# Patient Record
Sex: Female | Born: 1975 | Race: White | Hispanic: No
Health system: Southern US, Academic
[De-identification: ages and names within clinical notes are randomized; demographics above are authoritative.]

## PROBLEM LIST (undated history)

## (undated) ENCOUNTER — Ambulatory Visit: Payer: MEDICAID

## (undated) ENCOUNTER — Encounter: Attending: Infectious Disease | Primary: Infectious Disease

## (undated) ENCOUNTER — Encounter

## (undated) ENCOUNTER — Ambulatory Visit: Payer: MEDICAID | Attending: Registered" | Primary: Registered"

## (undated) ENCOUNTER — Telehealth

## (undated) ENCOUNTER — Encounter: Attending: Psychiatric/Mental Health | Primary: Psychiatric/Mental Health

## (undated) ENCOUNTER — Encounter
Attending: Student in an Organized Health Care Education/Training Program | Primary: Student in an Organized Health Care Education/Training Program

## (undated) ENCOUNTER — Encounter: Attending: Marriage & Family Therapist | Primary: Marriage & Family Therapist

## (undated) ENCOUNTER — Ambulatory Visit: Payer: MEDICAID | Attending: Family Medicine | Primary: Family Medicine

## (undated) ENCOUNTER — Ambulatory Visit: Payer: MEDICAID | Attending: Addiction (Substance Use Disorder) | Primary: Addiction (Substance Use Disorder)

## (undated) ENCOUNTER — Ambulatory Visit: Payer: Medicaid (Managed Care) | Attending: Orthopaedic Surgery | Primary: Orthopaedic Surgery

## (undated) ENCOUNTER — Ambulatory Visit: Payer: Medicaid (Managed Care)

## (undated) ENCOUNTER — Ambulatory Visit: Payer: MEDICAID | Attending: Psychiatric/Mental Health | Primary: Psychiatric/Mental Health

## (undated) ENCOUNTER — Ambulatory Visit: Payer: MEDICAID | Attending: Physical Medicine & Rehabilitation | Primary: Physical Medicine & Rehabilitation

## (undated) ENCOUNTER — Ambulatory Visit: Payer: PRIVATE HEALTH INSURANCE

## (undated) ENCOUNTER — Encounter: Attending: Clinical | Primary: Clinical

## (undated) ENCOUNTER — Ambulatory Visit

## (undated) ENCOUNTER — Encounter: Attending: "Endocrinology | Primary: "Endocrinology

## (undated) ENCOUNTER — Ambulatory Visit: Payer: PRIVATE HEALTH INSURANCE | Attending: Psychiatric/Mental Health | Primary: Psychiatric/Mental Health

## (undated) ENCOUNTER — Ambulatory Visit: Payer: MEDICAID | Attending: "Endocrinology | Primary: "Endocrinology

## (undated) ENCOUNTER — Ambulatory Visit
Payer: MEDICAID | Attending: Rehabilitative and Restorative Service Providers" | Primary: Rehabilitative and Restorative Service Providers"

## (undated) ENCOUNTER — Ambulatory Visit: Payer: PRIVATE HEALTH INSURANCE | Attending: Marriage & Family Therapist | Primary: Marriage & Family Therapist

## (undated) ENCOUNTER — Telehealth: Attending: Marriage & Family Therapist | Primary: Marriage & Family Therapist

## (undated) ENCOUNTER — Encounter
Attending: Rehabilitative and Restorative Service Providers" | Primary: Rehabilitative and Restorative Service Providers"

## (undated) ENCOUNTER — Ambulatory Visit: Payer: Medicaid (Managed Care) | Attending: Marriage & Family Therapist | Primary: Marriage & Family Therapist

## (undated) ENCOUNTER — Ambulatory Visit: Attending: Clinical | Primary: Clinical

## (undated) ENCOUNTER — Ambulatory Visit
Payer: Medicaid (Managed Care) | Attending: Physical Medicine & Rehabilitation | Primary: Physical Medicine & Rehabilitation

## (undated) ENCOUNTER — Telehealth: Attending: Psychiatric/Mental Health | Primary: Psychiatric/Mental Health

## (undated) ENCOUNTER — Ambulatory Visit
Payer: MEDICAID | Attending: Student in an Organized Health Care Education/Training Program | Primary: Student in an Organized Health Care Education/Training Program

## (undated) ENCOUNTER — Telehealth
Attending: Student in an Organized Health Care Education/Training Program | Primary: Student in an Organized Health Care Education/Training Program

## (undated) ENCOUNTER — Ambulatory Visit: Payer: MEDICAID | Attending: Community Health | Primary: Community Health

## (undated) ENCOUNTER — Encounter: Attending: Addiction (Substance Use Disorder) | Primary: Addiction (Substance Use Disorder)

## (undated) ENCOUNTER — Encounter: Payer: Medicaid (Managed Care) | Attending: Marriage & Family Therapist | Primary: Marriage & Family Therapist

## (undated) ENCOUNTER — Encounter: Attending: Physical Medicine & Rehabilitation | Primary: Physical Medicine & Rehabilitation

## (undated) ENCOUNTER — Encounter: Attending: Family Medicine | Primary: Family Medicine

## (undated) ENCOUNTER — Encounter: Attending: Orthopaedic Surgery | Primary: Orthopaedic Surgery

## (undated) ENCOUNTER — Ambulatory Visit: Payer: MEDICAID | Attending: Marriage & Family Therapist | Primary: Marriage & Family Therapist

## (undated) ENCOUNTER — Encounter: Attending: Diagnostic Radiology | Primary: Diagnostic Radiology

## (undated) ENCOUNTER — Ambulatory Visit: Payer: MEDICAID | Attending: Orthopaedic Surgery | Primary: Orthopaedic Surgery

## (undated) ENCOUNTER — Encounter: Attending: Community Health | Primary: Community Health

## (undated) ENCOUNTER — Telehealth: Attending: Social Worker | Primary: Social Worker

## (undated) ENCOUNTER — Telehealth: Attending: Orthopaedic Surgery | Primary: Orthopaedic Surgery

## (undated) ENCOUNTER — Encounter: Payer: Medicaid (Managed Care) | Attending: Psychiatric/Mental Health | Primary: Psychiatric/Mental Health

## (undated) ENCOUNTER — Ambulatory Visit: Payer: Worker's Compensation

## (undated) ENCOUNTER — Ambulatory Visit: Payer: Medicaid (Managed Care) | Attending: Psychiatric/Mental Health | Primary: Psychiatric/Mental Health

## (undated) ENCOUNTER — Encounter: Attending: Psychiatry | Primary: Psychiatry

## (undated) ENCOUNTER — Telehealth: Attending: Clinical | Primary: Clinical

## (undated) ENCOUNTER — Ambulatory Visit: Payer: MEDICAID | Attending: Internal Medicine | Primary: Internal Medicine

## (undated) ENCOUNTER — Ambulatory Visit: Attending: Orthopaedic Surgery | Primary: Orthopaedic Surgery

## (undated) ENCOUNTER — Encounter
Payer: Worker's Compensation | Attending: Physical Medicine & Rehabilitation | Primary: Physical Medicine & Rehabilitation

## (undated) ENCOUNTER — Ambulatory Visit: Payer: MEDICAID | Attending: Psychosomatic Medicine | Primary: Psychosomatic Medicine

## (undated) ENCOUNTER — Ambulatory Visit: Payer: MEDICAID | Attending: Physician Assistant | Primary: Physician Assistant

## (undated) ENCOUNTER — Inpatient Hospital Stay

## (undated) ENCOUNTER — Ambulatory Visit
Payer: Medicaid (Managed Care) | Attending: Rehabilitative and Restorative Service Providers" | Primary: Rehabilitative and Restorative Service Providers"

## (undated) ENCOUNTER — Telehealth: Attending: Addiction (Substance Use Disorder) | Primary: Addiction (Substance Use Disorder)

## (undated) ENCOUNTER — Encounter: Attending: Psychosomatic Medicine | Primary: Psychosomatic Medicine

## (undated) ENCOUNTER — Telehealth: Attending: Community Health | Primary: Community Health

## (undated) ENCOUNTER — Telehealth: Attending: Nephrology | Primary: Nephrology

## (undated) ENCOUNTER — Telehealth: Attending: "Endocrinology | Primary: "Endocrinology

## (undated) ENCOUNTER — Non-Acute Institutional Stay: Payer: Worker's Compensation

## (undated) ENCOUNTER — Telehealth: Attending: Internal Medicine | Primary: Internal Medicine

## (undated) MED ORDER — BUPROPION HCL SR 150 MG TABLET,12 HR SUSTAINED-RELEASE: 150 mg | tablet | Freq: Two times a day (BID) | 3 refills | 90 days | Status: CN

## (undated) MED ORDER — QUETIAPINE 50 MG TABLET: tablet | 0 refills | 0 days | Status: CN

## (undated) MED ORDER — CHOLECALCIFEROL (VITAMIN D3) 10 MCG (400 UNIT) CAPSULE: ORAL | 0 days

---
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
## 2011-05-01 ENCOUNTER — Emergency Department: Payer: Self-pay | Admitting: Emergency Medicine

## 2013-09-07 ENCOUNTER — Emergency Department: Payer: Self-pay | Admitting: Emergency Medicine

## 2013-09-07 LAB — COMPREHENSIVE METABOLIC PANEL
Albumin: 3.7 g/dL (ref 3.4–5.0)
Alkaline Phosphatase: 71 U/L
Anion Gap: 5 — ABNORMAL LOW (ref 7–16)
Bilirubin,Total: 0.2 mg/dL (ref 0.2–1.0)
Calcium, Total: 8.9 mg/dL (ref 8.5–10.1)
Chloride: 109 mmol/L — ABNORMAL HIGH (ref 98–107)
EGFR (Non-African Amer.): >60
Glucose: 100 mg/dL — ABNORMAL HIGH (ref 65–99)
Osmolality: 273 (ref 275–301)
Potassium: 3.9 mmol/L (ref 3.5–5.1)
SGPT (ALT): 22 U/L (ref 12–78)
Total Protein: 7.3 g/dL (ref 6.4–8.2)

## 2013-09-07 LAB — CBC
MCV: 96 fL (ref 80–100)
Platelet: 338 10*3/uL (ref 150–440)
RBC: 4.6 10*6/uL (ref 3.80–5.20)
RDW: 13.9 % (ref 11.5–14.5)
WBC: 13.1 10*3/uL — ABNORMAL HIGH (ref 3.6–11.0)

## 2013-09-08 LAB — URINALYSIS, COMPLETE
Glucose,UR: NEGATIVE mg/dL (ref 0–75)
Leukocyte Esterase: NEGATIVE
Nitrite: NEGATIVE
RBC,UR: 7 /HPF (ref 0–5)
Specific Gravity: 1.028 (ref 1.003–1.030)
Squamous Epithelial: 3
WBC UR: 5 /HPF (ref 0–5)

## 2013-09-12 ENCOUNTER — Emergency Department: Payer: Self-pay | Admitting: Emergency Medicine

## 2013-09-12 LAB — CBC
HGB: 16.6 g/dL — ABNORMAL HIGH (ref 12.0–16.0)
MCH: 32.2 pg (ref 26.0–34.0)
MCV: 96 fL (ref 80–100)
RBC: 5.14 10*6/uL (ref 3.80–5.20)

## 2013-09-12 LAB — URINALYSIS, COMPLETE
Glucose,UR: NEGATIVE mg/dL (ref 0–75)
Leukocyte Esterase: NEGATIVE
Nitrite: NEGATIVE
Ph: 6 (ref 4.5–8.0)
Protein: NEGATIVE
RBC,UR: 6 /HPF (ref 0–5)
Squamous Epithelial: 16

## 2013-09-12 LAB — COMPREHENSIVE METABOLIC PANEL
Albumin: 4.1 g/dL (ref 3.4–5.0)
Alkaline Phosphatase: 82 U/L
Anion Gap: 4 — ABNORMAL LOW (ref 7–16)
BUN: 8 mg/dL (ref 7–18)
Bilirubin,Total: 0.3 mg/dL (ref 0.2–1.0)
Calcium, Total: 9.2 mg/dL (ref 8.5–10.1)
EGFR (African American): >60
Potassium: 4 mmol/L (ref 3.5–5.1)
SGPT (ALT): 33 U/L (ref 12–78)
Sodium: 137 mmol/L (ref 136–145)
Total Protein: 8.4 g/dL — ABNORMAL HIGH (ref 6.4–8.2)

## 2013-09-12 LAB — LIPASE, BLOOD: Lipase: 78 U/L (ref 73–393)

## 2013-09-18 ENCOUNTER — Ambulatory Visit: Payer: Self-pay | Admitting: Obstetrics and Gynecology

## 2013-09-21 LAB — PATHOLOGY REPORT

## 2013-09-22 ENCOUNTER — Ambulatory Visit: Payer: Self-pay | Admitting: Obstetrics and Gynecology

## 2013-09-22 LAB — COMPREHENSIVE METABOLIC PANEL
Anion Gap: 7 (ref 7–16)
BUN: 6 mg/dL — ABNORMAL LOW (ref 7–18)
Bilirubin,Total: 0.1 mg/dL — ABNORMAL LOW (ref 0.2–1.0)
Chloride: 103 mmol/L (ref 98–107)
Co2: 29 mmol/L (ref 21–32)
Creatinine: 0.85 mg/dL (ref 0.60–1.30)
EGFR (African American): >60
EGFR (Non-African Amer.): >60
Osmolality: 275 (ref 275–301)
Potassium: 3.9 mmol/L (ref 3.5–5.1)
SGPT (ALT): 24 U/L (ref 12–78)
Sodium: 139 mmol/L (ref 136–145)
Total Protein: 6.9 g/dL (ref 6.4–8.2)

## 2013-09-22 LAB — CBC WITH DIFFERENTIAL/PLATELET
Eosinophil #: 0.4 10*3/uL (ref 0.0–0.7)
Eosinophil %: 4.2 %
HGB: 14.3 g/dL (ref 12.0–16.0)
Lymphocyte #: 4.1 10*3/uL — ABNORMAL HIGH (ref 1.0–3.6)
MCHC: 33.2 g/dL (ref 32.0–36.0)
MCV: 97 fL (ref 80–100)
Monocyte #: 0.7 x10 3/mm (ref 0.2–0.9)
Monocyte %: 7 %
Neutrophil #: 4.7 10*3/uL (ref 1.4–6.5)
RBC: 4.43 10*6/uL (ref 3.80–5.20)
RDW: 13 % (ref 11.5–14.5)

## 2013-09-28 ENCOUNTER — Emergency Department: Payer: Self-pay | Admitting: Emergency Medicine

## 2013-09-28 LAB — COMPREHENSIVE METABOLIC PANEL
ALBUMIN: 4 g/dL (ref 3.4–5.0)
ALK PHOS: 84 U/L
Anion Gap: 6 — ABNORMAL LOW (ref 7–16)
BILIRUBIN TOTAL: 0.3 mg/dL (ref 0.2–1.0)
BUN: 6 mg/dL — ABNORMAL LOW (ref 7–18)
CALCIUM: 9.3 mg/dL (ref 8.5–10.1)
CO2: 25 mmol/L (ref 21–32)
CREATININE: 0.79 mg/dL (ref 0.60–1.30)
Chloride: 104 mmol/L (ref 98–107)
EGFR (African American): >60
EGFR (Non-African Amer.): >60
GLUCOSE: 94 mg/dL (ref 65–99)
OSMOLALITY: 267 (ref 275–301)
POTASSIUM: 3.7 mmol/L (ref 3.5–5.1)
SGOT(AST): 39 U/L — ABNORMAL HIGH (ref 15–37)
SGPT (ALT): 56 U/L (ref 12–78)
SODIUM: 135 mmol/L — AB (ref 136–145)
Total Protein: 8.3 g/dL — ABNORMAL HIGH (ref 6.4–8.2)

## 2013-09-28 LAB — URINALYSIS, COMPLETE
BILIRUBIN, UR: NEGATIVE
Bacteria: NONE SEEN
Glucose,UR: NEGATIVE mg/dL (ref 0–75)
KETONE: NEGATIVE
Leukocyte Esterase: NEGATIVE
Nitrite: NEGATIVE
PROTEIN: NEGATIVE
Ph: 6 (ref 4.5–8.0)
RBC,UR: 3 /HPF (ref 0–5)
Specific Gravity: 1.015 (ref 1.003–1.030)
Squamous Epithelial: 13

## 2013-09-28 LAB — CBC WITH DIFFERENTIAL/PLATELET
Basophil #: 0.2 10*3/uL — ABNORMAL HIGH (ref 0.0–0.1)
Basophil %: 1.2 %
Eosinophil #: 0.1 10*3/uL (ref 0.0–0.7)
Eosinophil %: 0.8 %
HCT: 50.7 % — ABNORMAL HIGH (ref 35.0–47.0)
HGB: 17.3 g/dL — ABNORMAL HIGH (ref 12.0–16.0)
Lymphocyte #: 3.3 10*3/uL (ref 1.0–3.6)
Lymphocyte %: 24.6 %
MCH: 32.4 pg (ref 26.0–34.0)
MCHC: 34.1 g/dL (ref 32.0–36.0)
MCV: 95 fL (ref 80–100)
Monocyte #: 1.2 x10 3/mm — ABNORMAL HIGH (ref 0.2–0.9)
Monocyte %: 8.7 %
Neutrophil #: 8.8 10*3/uL — ABNORMAL HIGH (ref 1.4–6.5)
Neutrophil %: 64.7 %
Platelet: 367 10*3/uL (ref 150–440)
RBC: 5.33 10*6/uL — ABNORMAL HIGH (ref 3.80–5.20)
RDW: 13.7 % (ref 11.5–14.5)
WBC: 13.6 10*3/uL — ABNORMAL HIGH (ref 3.6–11.0)

## 2013-09-28 LAB — LIPASE, BLOOD: Lipase: 122 U/L (ref 73–393)

## 2014-11-22 ENCOUNTER — Emergency Department: Payer: Self-pay | Admitting: Emergency Medicine

## 2015-01-14 NOTE — Consult Note (Signed)
Consulting Service: Emergency Department Consulting Physician:  Bayard MalesBrown, Forrest MD  Consulting Question: "Worsening abdominal pain"  History of present Illness: Patient is a 39 year old G3P1021 with ER presentation on 09/12/2013 for evaluation of abdominal pain showing a 7cm right ovarian dermoid cyst.  At that time there was no evidence of torsion although no normal right ovarian tissue could be identified.  The patient has not personal or family history of ovarian cancer or breast cancer.  Pain started about 1 week ago.  Some associated nausea.  She was original scheduled for laproscopic right salpingo-oophorectomy.  Patient was provided a prescription for Percocet and ibuprofen at the time of her clinic evaluation on 09/16/2013.  This evening had sudden exacerbation of pain unrelieved by percocet prompting her presentation to OR.  Review of Systems: 10 point review of systems negative unless otherwise  noted in HPI Past Medical History: none Past Surgical History: Tonsillectomy Family History: Colon cancer, HTN, leukemia Social History: Occasional EtOH, smoker, no illicit substance use Allergies: NKDA Medications: 1) Ibuprofen 600mg  po 1 tab po every 6hrs prn pain 2) Percocet 5/325 1-2 tab po every 4hrs prn pain Physical Exam  Vital Signs: T 95.3, BP 132/79, HR 86, RR 20, O2sat 99% RAAppears uncomfortablenormocephalic, anictericCTABRRRNABS, soft, RLQ pain and tenderness on medium depth palpationdeferred see TVUS reportno edema Laboratory Lactic acid 0.9 : 39 yo with 7cm dermoid cyst, abdominal pain, concern for torsion Plan: 1) Abdominal pain ? in setting of 7cm dermoid cyst concern if for ovarian torsion.  The patient is scheduled for surgery later today.  Will post for emergent right salpingo-oophorectomy. a) No preoperative antibiotics,surgical prep chlorhexaind abdomen, betadine vaginal. dorsal lithotomy position using Allen stirups b) SCD?s for DVT ppx c) Specimen for permanent  section d) Admit to OB/GYN postoperative with anticipated discharge later this morning if doing well    Electronic Signatures: Lorrene ReidStaebler, Jaquon Gingerich M (MD) (Signed on 26-Dec-14 01:49)  Authored   Last Updated: 26-Dec-14 01:52 by Lorrene ReidStaebler, Eswin Worrell M (MD)

## 2015-01-15 NOTE — Op Note (Signed)
PATIENT NAME:  Victoria Ford, Victoria Ford MR#:  [id number] DATE OF BIRTH:  1976/07/12  DATE OF PROCEDURE:  09/18/2013  PREOPERATIVE DIAGNOSES: Abdominal pain and right ovarian cyst.  POSTOPERATIVE DIAGNOSES: Abdominal pain and right ovarian cyst, appearance consistent with a right dermoid.   OPERATION PERFORMED: Laparoscopic right salpingo-oophorectomy.  ANESTHESIA USED: General.   PRIMARY SURGEON: Lorrene ReidAndreas Ford Osha Rane, MD.  ESTIMATED BLOOD LOSS: Minimal.  OPERATIVE FLUIDS: 1 L.  COMPLICATIONS: None.  PREOPERATIVE ANTIBIOTICS: None.   FINDINGS: Large 8 cm right ovarian dermoid cyst with no evidence of torsion, cyst is ruptured in the bag facilitating removal. The right ureter was visualized. Ureter seen coursing away from fiel of dissection prior to and post resection of the ovary. The remainder of the pelvic anatomy is normal.   SPECIMENS REMOVED: Right ovary and tube.   CONDITION FOLLOWING PROCEDURE: Stable.   PROCEDURE IN DETAIL: Risks, benefits and alternatives of the procedure were discussed with the patient prior to proceeding to operating room. The patient was taken to the operating room where she was placed under general endotracheal anesthesia. The patient was prepped and draped in the usual sterile fashion after being positioned in the dorsal lithotomy position using Allen stirrups. Timeout procedure was performed. Attention was turned to the patient's pelvis. An operative speculum was placed, the cervix was visualized. The cervix was grasped with a single-tooth tenaculum and a Hulka tenaculum was then inserted through the cervix to allow manipulation of the uterus. The single-tooth tenaculum and operative  speculum were removed. Attention was turned to the patient's abdomen. A left upper quadrant entry at Palmer's point was undertaken using direct visualization and an Xcel trocar. After gaining entry into the perineum, pneumoperitoneum was established. Two assistant ports, one a 5 mm  umbilical as well as one 11 mm lateral port were placed under visualization. Inspection of the pelvis noted the above findings. The right infundibulopelvic ligament was identified and ligated and transected using the LigaSure device. The tube and ovary were then freed from their attachments from the mesosalpinx using the LigaSure and then transected off the corneal portion of the uterus using the LigaSure device as well.   Following resection of the ovary and dermoid,  the specimen was placed in an Endo Catch bag where it was ruptured. The specimen was drained and then removed through the 10 mm port site. The pelvis was copiously irrigated. The pedicles were inspected and noted to be hemostatic. The ureters were noted to be well away from the area of dissection. The 11 mm port site was closed using an Endo-stitch device. Following closure of the port site, the skin was closed using 4-0 Monocryl in subcuticular fashion. Each port site was then dressed with Dermabond. Sponge, needle, and instrument counts were correct x 2. The patient tolerated the procedure well and was taken to the recovery room in stable condition.    ____________________________ Florina OuAndreas Ford. Bonney AidStaebler, MD ams:aw D: 09/20/2013 22:32:28 ET T: 09/21/2013 04:[id number]:28:24 ET JOB#: [id number]  cc: Florina OuAndreas Ford. Bonney AidStaebler, MD, <Dictator> Carmel SacramentoANDREAS Cathrine MusterM Haward Pope MD ELECTRONICALLY SIGNED 10/15/2013 8:52
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
## 2017-05-22 ENCOUNTER — Ambulatory Visit: Admission: RE | Admit: 2017-05-22 | Discharge: 2017-05-22 | Payer: MEDICAID | Admitting: Family Medicine

## 2017-05-22 DIAGNOSIS — F1910 Other psychoactive substance abuse, uncomplicated: Secondary | ICD-10-CM

## 2017-05-22 DIAGNOSIS — M545 Low back pain: Secondary | ICD-10-CM

## 2017-05-22 DIAGNOSIS — R768 Other specified abnormal immunological findings in serum: Secondary | ICD-10-CM

## 2017-05-22 DIAGNOSIS — G8929 Other chronic pain: Secondary | ICD-10-CM

## 2017-05-22 DIAGNOSIS — F331 Major depressive disorder, recurrent, moderate: Secondary | ICD-10-CM

## 2017-05-22 DIAGNOSIS — F411 Generalized anxiety disorder: Principal | ICD-10-CM

## 2017-05-22 DIAGNOSIS — Z23 Encounter for immunization: Secondary | ICD-10-CM

## 2017-05-22 DIAGNOSIS — F17200 Nicotine dependence, unspecified, uncomplicated: Secondary | ICD-10-CM

## 2017-05-23 ENCOUNTER — Ambulatory Visit
Admission: RE | Admit: 2017-05-23 | Discharge: 2017-05-23 | Disposition: A | Discharge status: Home / Self Care | Payer: MEDICAID

## 2017-05-23 DIAGNOSIS — M545 Low back pain: Principal | ICD-10-CM

## 2017-05-23 DIAGNOSIS — G8929 Other chronic pain: Secondary | ICD-10-CM

## 2017-05-24 MED ORDER — VENLAFAXINE ER 75 MG CAPSULE,EXTENDED RELEASE 24 HR
ORAL_CAPSULE | Freq: Every day | ORAL | 0 refills | 0 days | Status: CP
Start: 2017-05-24 — End: 2017-06-19

## 2017-06-19 ENCOUNTER — Ambulatory Visit: Admission: RE | Admit: 2017-06-19 | Discharge: 2017-06-19 | Payer: MEDICAID

## 2017-06-19 DIAGNOSIS — F411 Generalized anxiety disorder: Secondary | ICD-10-CM

## 2017-06-19 DIAGNOSIS — F331 Major depressive disorder, recurrent, moderate: Principal | ICD-10-CM

## 2017-06-19 MED ORDER — VENLAFAXINE ER 75 MG CAPSULE,EXTENDED RELEASE 24 HR
ORAL_CAPSULE | 0 refills | 0 days | Status: CP
Start: 2017-06-19 — End: 2017-07-17

## 2017-07-17 ENCOUNTER — Ambulatory Visit: Admission: RE | Admit: 2017-07-17 | Discharge: 2017-07-17 | Payer: MEDICAID

## 2017-07-17 DIAGNOSIS — F331 Major depressive disorder, recurrent, moderate: Principal | ICD-10-CM

## 2017-07-17 DIAGNOSIS — J0100 Acute maxillary sinusitis, unspecified: Secondary | ICD-10-CM

## 2017-07-17 DIAGNOSIS — F411 Generalized anxiety disorder: Secondary | ICD-10-CM

## 2017-07-17 DIAGNOSIS — Z124 Encounter for screening for malignant neoplasm of cervix: Secondary | ICD-10-CM

## 2017-07-17 MED ORDER — DULOXETINE 30 MG CAPSULE,DELAYED RELEASE
ORAL_CAPSULE | 0 refills | 0 days | Status: CP
Start: 2017-07-17 — End: 2017-08-21

## 2017-07-17 MED ORDER — AMOXICILLIN 875 MG-POTASSIUM CLAVULANATE 125 MG TABLET
ORAL_TABLET | Freq: Two times a day (BID) | ORAL | 0 refills | 0 days | Status: CP
Start: 2017-07-17 — End: 2017-12-30

## 2017-08-02 ENCOUNTER — Emergency Department
Admission: EM | Admit: 2017-08-02 | Discharge: 2017-08-02 | Disposition: A | Discharge status: Home / Self Care | Payer: MEDICAID | Source: Intra-hospital

## 2017-08-02 DIAGNOSIS — R05 Cough: Principal | ICD-10-CM

## 2017-08-05 ENCOUNTER — Ambulatory Visit: Admission: RE | Admit: 2017-08-05 | Discharge: 2017-08-05 | Payer: MEDICAID | Attending: Family | Admitting: Family

## 2017-08-05 DIAGNOSIS — R05 Cough: Principal | ICD-10-CM

## 2017-08-05 DIAGNOSIS — F1910 Other psychoactive substance abuse, uncomplicated: Secondary | ICD-10-CM

## 2017-08-05 MED ORDER — ALBUTEROL SULFATE CONCENTRATE 2.5 MG/0.5 ML SOLUTION FOR NEBULIZATION
Freq: Four times a day (QID) | RESPIRATORY_TRACT | 0 refills | 0.00000 days | Status: CP | PRN
Start: 2017-08-05 — End: 2018-04-30

## 2017-08-05 MED ORDER — ALBUTEROL SULFATE HFA 90 MCG/ACTUATION AEROSOL INHALER
Freq: Four times a day (QID) | RESPIRATORY_TRACT | 1 refills | 0 days | Status: CP | PRN
Start: 2017-08-05 — End: 2018-08-07

## 2017-08-05 MED ORDER — DOXYCYCLINE MONOHYDRATE 100 MG CAPSULE
ORAL_CAPSULE | Freq: Two times a day (BID) | ORAL | 0 refills | 0 days | Status: CP
Start: 2017-08-05 — End: 2017-12-30

## 2017-08-21 ENCOUNTER — Ambulatory Visit: Admission: RE | Admit: 2017-08-21 | Discharge: 2017-08-21 | Payer: MEDICAID | Admitting: Family Medicine

## 2017-08-21 DIAGNOSIS — G479 Sleep disorder, unspecified: Secondary | ICD-10-CM

## 2017-08-21 DIAGNOSIS — F331 Major depressive disorder, recurrent, moderate: Principal | ICD-10-CM

## 2017-08-21 DIAGNOSIS — F411 Generalized anxiety disorder: Secondary | ICD-10-CM

## 2017-08-21 MED ORDER — PRAZOSIN 1 MG CAPSULE
ORAL_CAPSULE | 0 refills | 0 days | Status: CP
Start: 2017-08-21 — End: 2017-09-20

## 2017-08-21 MED ORDER — DULOXETINE 60 MG CAPSULE,DELAYED RELEASE
ORAL_CAPSULE | Freq: Every day | ORAL | 1 refills | 0 days | Status: CP
Start: 2017-08-21 — End: 2018-02-13

## 2017-09-20 MED ORDER — PRAZOSIN 5 MG CAPSULE
ORAL_CAPSULE | 1 refills | 0 days | Status: CP
Start: 2017-09-20 — End: 2017-11-15

## 2017-10-01 ENCOUNTER — Ambulatory Visit: Admit: 2017-10-01 | Discharge: 2017-10-02 | Payer: MEDICAID | Attending: Family Medicine | Primary: Family Medicine

## 2017-10-01 DIAGNOSIS — M25532 Pain in left wrist: Secondary | ICD-10-CM

## 2017-10-01 DIAGNOSIS — G479 Sleep disorder, unspecified: Secondary | ICD-10-CM

## 2017-10-01 DIAGNOSIS — Z124 Encounter for screening for malignant neoplasm of cervix: Secondary | ICD-10-CM

## 2017-10-01 DIAGNOSIS — M20012 Mallet finger of left finger(s): Secondary | ICD-10-CM

## 2017-10-01 DIAGNOSIS — F4310 Post-traumatic stress disorder, unspecified: Secondary | ICD-10-CM

## 2017-10-01 DIAGNOSIS — F411 Generalized anxiety disorder: Secondary | ICD-10-CM

## 2017-10-01 DIAGNOSIS — F331 Major depressive disorder, recurrent, moderate: Principal | ICD-10-CM

## 2017-10-31 ENCOUNTER — Ambulatory Visit
Admit: 2017-10-31 | Discharge: 2017-11-01 | Payer: MEDICAID | Attending: Orthopaedic Surgery | Primary: Orthopaedic Surgery

## 2017-10-31 DIAGNOSIS — M25532 Pain in left wrist: Principal | ICD-10-CM

## 2017-10-31 DIAGNOSIS — M7711 Lateral epicondylitis, right elbow: Secondary | ICD-10-CM

## 2017-10-31 DIAGNOSIS — G5600 Carpal tunnel syndrome, unspecified upper limb: Secondary | ICD-10-CM

## 2017-10-31 DIAGNOSIS — M654 Radial styloid tenosynovitis [de Quervain]: Secondary | ICD-10-CM

## 2017-11-18 MED ORDER — PRAZOSIN 5 MG CAPSULE
ORAL_CAPSULE | 1 refills | 0 days | Status: CP
Start: 2017-11-18 — End: 2017-12-30

## 2017-12-30 ENCOUNTER — Ambulatory Visit: Admit: 2017-12-30 | Discharge: 2017-12-31 | Payer: MEDICAID | Attending: Family Medicine | Primary: Family Medicine

## 2017-12-30 DIAGNOSIS — R42 Dizziness and giddiness: Principal | ICD-10-CM

## 2017-12-30 DIAGNOSIS — F411 Generalized anxiety disorder: Secondary | ICD-10-CM

## 2017-12-30 DIAGNOSIS — D649 Anemia, unspecified: Secondary | ICD-10-CM

## 2017-12-30 DIAGNOSIS — F331 Major depressive disorder, recurrent, moderate: Secondary | ICD-10-CM

## 2017-12-30 DIAGNOSIS — R9389 Abnormal findings on diagnostic imaging of other specified body structures: Secondary | ICD-10-CM

## 2017-12-30 DIAGNOSIS — G479 Sleep disorder, unspecified: Secondary | ICD-10-CM

## 2017-12-30 DIAGNOSIS — F17200 Nicotine dependence, unspecified, uncomplicated: Secondary | ICD-10-CM

## 2017-12-30 DIAGNOSIS — Z131 Encounter for screening for diabetes mellitus: Secondary | ICD-10-CM

## 2017-12-30 DIAGNOSIS — E041 Nontoxic single thyroid nodule: Secondary | ICD-10-CM

## 2018-01-06 MED ORDER — LEVOTHYROXINE 25 MCG TABLET
ORAL_TABLET | Freq: Every day | ORAL | 0 refills | 0 days | Status: CP
Start: 2018-01-06 — End: 2018-04-01

## 2018-01-16 ENCOUNTER — Ambulatory Visit
Admit: 2018-01-16 | Discharge: 2018-01-17 | Payer: MEDICAID | Attending: Psychiatric/Mental Health | Primary: Psychiatric/Mental Health

## 2018-01-16 DIAGNOSIS — F331 Major depressive disorder, recurrent, moderate: Principal | ICD-10-CM

## 2018-01-16 DIAGNOSIS — F1121 Opioid dependence, in remission: Secondary | ICD-10-CM

## 2018-01-16 DIAGNOSIS — F4310 Post-traumatic stress disorder, unspecified: Secondary | ICD-10-CM

## 2018-01-16 MED ORDER — ARIPIPRAZOLE 10 MG TABLET
ORAL_TABLET | 0 refills | 0 days | Status: CP
Start: 2018-01-16 — End: 2018-02-04

## 2018-01-16 MED ORDER — DULOXETINE 30 MG CAPSULE,DELAYED RELEASE
ORAL_CAPSULE | 0 refills | 0 days | Status: CP
Start: 2018-01-16 — End: 2018-02-13

## 2018-01-30 ENCOUNTER — Ambulatory Visit
Admit: 2018-01-30 | Discharge: 2018-01-31 | Payer: MEDICAID | Attending: Psychiatric/Mental Health | Primary: Psychiatric/Mental Health

## 2018-01-30 DIAGNOSIS — F1990 Other psychoactive substance use, unspecified, uncomplicated: Principal | ICD-10-CM

## 2018-01-30 DIAGNOSIS — F4310 Post-traumatic stress disorder, unspecified: Secondary | ICD-10-CM

## 2018-01-30 MED ORDER — GABAPENTIN 300 MG CAPSULE
ORAL_CAPSULE | Freq: Three times a day (TID) | ORAL | 0 refills | 0 days | Status: CP
Start: 2018-01-30 — End: 2018-02-13

## 2018-02-04 MED ORDER — ARIPIPRAZOLE 10 MG TABLET
ORAL_TABLET | 0 refills | 0 days | Status: CP
Start: 2018-02-04 — End: 2018-02-13

## 2018-02-13 ENCOUNTER — Ambulatory Visit
Admit: 2018-02-13 | Discharge: 2018-02-14 | Payer: MEDICAID | Attending: Psychiatric/Mental Health | Primary: Psychiatric/Mental Health

## 2018-02-13 DIAGNOSIS — G479 Sleep disorder, unspecified: Principal | ICD-10-CM

## 2018-02-13 MED ORDER — PRAZOSIN 5 MG CAPSULE
ORAL_CAPSULE | 0 refills | 0 days | Status: CP
Start: 2018-02-13 — End: 2018-02-27

## 2018-02-13 MED ORDER — ARIPIPRAZOLE 10 MG TABLET
ORAL_TABLET | 0 refills | 0 days | Status: CP
Start: 2018-02-13 — End: 2018-02-27

## 2018-02-13 MED ORDER — GABAPENTIN 300 MG CAPSULE
ORAL_CAPSULE | Freq: Three times a day (TID) | ORAL | 0 refills | 0 days | Status: CP
Start: 2018-02-13 — End: 2018-02-27

## 2018-02-13 MED ORDER — DULOXETINE 30 MG CAPSULE,DELAYED RELEASE
ORAL_CAPSULE | 0 refills | 0 days | Status: CP
Start: 2018-02-13 — End: 2018-02-27

## 2018-02-27 ENCOUNTER — Ambulatory Visit
Admit: 2018-02-27 | Discharge: 2018-02-28 | Payer: MEDICAID | Attending: Psychiatric/Mental Health | Primary: Psychiatric/Mental Health

## 2018-02-27 DIAGNOSIS — F4310 Post-traumatic stress disorder, unspecified: Secondary | ICD-10-CM

## 2018-02-27 DIAGNOSIS — F1990 Other psychoactive substance use, unspecified, uncomplicated: Secondary | ICD-10-CM

## 2018-02-27 DIAGNOSIS — G479 Sleep disorder, unspecified: Principal | ICD-10-CM

## 2018-02-27 MED ORDER — ARIPIPRAZOLE 10 MG TABLET
ORAL_TABLET | 2 refills | 0 days | Status: SS
Start: 2018-02-27 — End: 2018-04-20

## 2018-02-27 MED ORDER — GABAPENTIN 300 MG CAPSULE
ORAL_CAPSULE | Freq: Three times a day (TID) | ORAL | 2 refills | 0.00000 days | Status: CP
Start: 2018-02-27 — End: 2018-03-13

## 2018-02-27 MED ORDER — PRAZOSIN 5 MG CAPSULE
ORAL_CAPSULE | 2 refills | 0 days | Status: CP
Start: 2018-02-27 — End: 2018-06-09

## 2018-03-13 ENCOUNTER — Ambulatory Visit
Admit: 2018-03-13 | Discharge: 2018-03-14 | Payer: MEDICAID | Attending: Psychosomatic Medicine | Primary: Psychosomatic Medicine

## 2018-03-13 DIAGNOSIS — F411 Generalized anxiety disorder: Secondary | ICD-10-CM

## 2018-03-13 DIAGNOSIS — F1121 Opioid dependence, in remission: Secondary | ICD-10-CM

## 2018-03-13 DIAGNOSIS — F329 Major depressive disorder, single episode, unspecified: Principal | ICD-10-CM

## 2018-03-13 DIAGNOSIS — Z0189 Encounter for other specified special examinations: Secondary | ICD-10-CM

## 2018-03-13 DIAGNOSIS — F4310 Post-traumatic stress disorder, unspecified: Secondary | ICD-10-CM

## 2018-03-13 DIAGNOSIS — Z79899 Other long term (current) drug therapy: Secondary | ICD-10-CM

## 2018-03-13 MED ORDER — MIRTAZAPINE 15 MG TABLET
ORAL_TABLET | 1 refills | 0 days | Status: CP
Start: 2018-03-13 — End: 2018-04-02

## 2018-03-13 MED ORDER — GABAPENTIN 300 MG CAPSULE
ORAL_CAPSULE | Freq: Three times a day (TID) | ORAL | 2 refills | 0.00000 days | Status: SS
Start: 2018-03-13 — End: 2018-04-30

## 2018-03-31 ENCOUNTER — Ambulatory Visit: Admit: 2018-03-31 | Discharge: 2018-03-31 | Payer: MEDICAID | Attending: Family Medicine | Primary: Family Medicine

## 2018-03-31 DIAGNOSIS — R9389 Abnormal findings on diagnostic imaging of other specified body structures: Secondary | ICD-10-CM

## 2018-03-31 DIAGNOSIS — F411 Generalized anxiety disorder: Secondary | ICD-10-CM

## 2018-03-31 DIAGNOSIS — M545 Low back pain: Principal | ICD-10-CM

## 2018-03-31 DIAGNOSIS — F17200 Nicotine dependence, unspecified, uncomplicated: Secondary | ICD-10-CM

## 2018-03-31 DIAGNOSIS — F331 Major depressive disorder, recurrent, moderate: Secondary | ICD-10-CM

## 2018-03-31 DIAGNOSIS — E041 Nontoxic single thyroid nodule: Secondary | ICD-10-CM

## 2018-03-31 DIAGNOSIS — R7303 Prediabetes: Secondary | ICD-10-CM

## 2018-03-31 DIAGNOSIS — E039 Hypothyroidism, unspecified: Secondary | ICD-10-CM

## 2018-03-31 DIAGNOSIS — F4310 Post-traumatic stress disorder, unspecified: Secondary | ICD-10-CM

## 2018-03-31 DIAGNOSIS — G8929 Other chronic pain: Secondary | ICD-10-CM

## 2018-03-31 DIAGNOSIS — Z13220 Encounter for screening for lipoid disorders: Secondary | ICD-10-CM

## 2018-03-31 DIAGNOSIS — M7989 Other specified soft tissue disorders: Secondary | ICD-10-CM

## 2018-04-01 MED ORDER — LEVOTHYROXINE 50 MCG TABLET
ORAL_TABLET | Freq: Every day | ORAL | 0 refills | 0.00000 days | Status: CP
Start: 2018-04-01 — End: 2018-06-11

## 2018-04-02 ENCOUNTER — Ambulatory Visit
Admit: 2018-04-02 | Discharge: 2018-04-03 | Payer: MEDICAID | Attending: Psychosomatic Medicine | Primary: Psychosomatic Medicine

## 2018-04-02 DIAGNOSIS — F419 Anxiety disorder, unspecified: Secondary | ICD-10-CM

## 2018-04-02 DIAGNOSIS — F1121 Opioid dependence, in remission: Secondary | ICD-10-CM

## 2018-04-02 DIAGNOSIS — F329 Major depressive disorder, single episode, unspecified: Principal | ICD-10-CM

## 2018-04-02 MED ORDER — QUETIAPINE 100 MG TABLET
ORAL_TABLET | 0 refills | 0 days | Status: SS
Start: 2018-04-02 — End: 2018-04-20

## 2018-04-02 MED ORDER — MIRTAZAPINE 30 MG TABLET
ORAL_TABLET | Freq: Every evening | ORAL | 3 refills | 0 days | Status: SS
Start: 2018-04-02 — End: 2018-05-09

## 2018-04-07 ENCOUNTER — Ambulatory Visit: Admit: 2018-04-07 | Discharge: 2018-04-08 | Payer: MEDICAID

## 2018-04-07 DIAGNOSIS — R9389 Abnormal findings on diagnostic imaging of other specified body structures: Principal | ICD-10-CM

## 2018-04-07 DIAGNOSIS — E041 Nontoxic single thyroid nodule: Principal | ICD-10-CM

## 2018-04-18 DIAGNOSIS — M65842 Other synovitis and tenosynovitis, left hand: Principal | ICD-10-CM

## 2018-04-19 ENCOUNTER — Encounter
Admit: 2018-04-19 | Discharge: 2018-04-30 | Disposition: A | Discharge status: Skilled Nursing Facility | Payer: MEDICAID | Source: Ambulatory Visit | Attending: Anesthesiology | Admitting: Orthopaedic Surgery

## 2018-04-19 ENCOUNTER — Ambulatory Visit
Admit: 2018-04-19 | Discharge: 2018-04-30 | Disposition: A | Discharge status: Skilled Nursing Facility | Payer: MEDICAID | Source: Ambulatory Visit | Admitting: Orthopaedic Surgery

## 2018-04-19 DIAGNOSIS — M65842 Other synovitis and tenosynovitis, left hand: Principal | ICD-10-CM

## 2018-04-20 DIAGNOSIS — M65842 Other synovitis and tenosynovitis, left hand: Principal | ICD-10-CM

## 2018-04-20 MED ORDER — CEFTAROLINE IVPB IN 50 ML
Freq: Two times a day (BID) | INTRAVENOUS | 0 refills | 0 days | Status: CP
Start: 2018-04-20 — End: 2018-05-09

## 2018-04-20 MED ORDER — PIPERACILLIN-TAZOBACTAM 3.375 GRAM/50 ML DEXTROSE(ISO-OS) IV PIGGYBACK
Freq: Three times a day (TID) | INTRAVENOUS | 0 refills | 0 days | Status: CP
Start: 2018-04-20 — End: 2018-04-27

## 2018-04-27 MED ORDER — CEFTRIAXONE IVPB 2 GRAM CONNECTOR BAG
INTRAVENOUS | 0 refills | 0.00000 days | Status: CP
Start: 2018-04-27 — End: 2018-05-09

## 2018-04-30 ENCOUNTER — Encounter: Admit: 2018-04-30 | Discharge: 2018-05-09 | Disposition: A | Discharge status: Home / Self Care | Payer: MEDICAID

## 2018-04-30 ENCOUNTER — Ambulatory Visit: Admit: 2018-04-30 | Discharge: 2018-05-09 | Disposition: A | Discharge status: Home / Self Care | Payer: MEDICAID

## 2018-04-30 ENCOUNTER — Encounter
Admit: 2018-04-30 | Discharge: 2018-05-09 | Disposition: A | Discharge status: Home / Self Care | Payer: MEDICAID | Attending: Family Medicine

## 2018-04-30 MED ORDER — GABAPENTIN 300 MG CAPSULE
ORAL_CAPSULE | Freq: Three times a day (TID) | ORAL | 0 refills | 0 days | Status: CP
Start: 2018-04-30 — End: 2018-06-09

## 2018-05-01 DIAGNOSIS — M65142 Other infective (teno)synovitis, left hand: Principal | ICD-10-CM

## 2018-05-04 DIAGNOSIS — M65142 Other infective (teno)synovitis, left hand: Principal | ICD-10-CM

## 2018-05-06 ENCOUNTER — Ambulatory Visit
Admit: 2018-05-06 | Discharge: 2018-05-07 | Payer: MEDICAID | Attending: Orthopaedic Surgery | Primary: Orthopaedic Surgery

## 2018-05-06 DIAGNOSIS — M65142 Other infective (teno)synovitis, left hand: Principal | ICD-10-CM

## 2018-05-06 DIAGNOSIS — M659 Synovitis and tenosynovitis, unspecified: Principal | ICD-10-CM

## 2018-05-07 DIAGNOSIS — M65142 Other infective (teno)synovitis, left hand: Principal | ICD-10-CM

## 2018-05-09 MED ORDER — IBUPROFEN 800 MG TABLET
ORAL_TABLET | Freq: Three times a day (TID) | ORAL | 0 refills | 0 days | Status: CP | PRN
Start: 2018-05-09 — End: 2018-06-10

## 2018-05-09 MED ORDER — MIRTAZAPINE 30 MG TABLET
ORAL_TABLET | Freq: Every evening | ORAL | 3 refills | 0 days
Start: 2018-05-09 — End: 2018-06-19

## 2018-05-14 ENCOUNTER — Ambulatory Visit: Admit: 2018-05-14 | Discharge: 2018-05-15 | Payer: MEDICAID | Attending: Family Medicine | Primary: Family Medicine

## 2018-05-14 DIAGNOSIS — M659 Synovitis and tenosynovitis, unspecified: Secondary | ICD-10-CM

## 2018-05-14 DIAGNOSIS — R635 Abnormal weight gain: Secondary | ICD-10-CM

## 2018-05-14 DIAGNOSIS — F331 Major depressive disorder, recurrent, moderate: Secondary | ICD-10-CM

## 2018-05-14 DIAGNOSIS — E039 Hypothyroidism, unspecified: Secondary | ICD-10-CM

## 2018-05-14 DIAGNOSIS — J984 Other disorders of lung: Secondary | ICD-10-CM

## 2018-05-14 DIAGNOSIS — E041 Nontoxic single thyroid nodule: Principal | ICD-10-CM

## 2018-05-14 MED ORDER — FUROSEMIDE 20 MG TABLET
ORAL_TABLET | 0 refills | 0 days | Status: CP
Start: 2018-05-14 — End: 2019-04-20

## 2018-05-19 ENCOUNTER — Ambulatory Visit
Admit: 2018-05-19 | Discharge: 2018-06-17 | Payer: MEDICAID | Attending: Rehabilitative and Restorative Service Providers" | Primary: Rehabilitative and Restorative Service Providers"

## 2018-05-19 DIAGNOSIS — M659 Synovitis and tenosynovitis, unspecified: Secondary | ICD-10-CM

## 2018-05-19 DIAGNOSIS — M25642 Stiffness of left hand, not elsewhere classified: Principal | ICD-10-CM

## 2018-05-20 ENCOUNTER — Ambulatory Visit
Admit: 2018-05-20 | Discharge: 2018-05-20 | Payer: MEDICAID | Attending: Orthopaedic Surgery | Primary: Orthopaedic Surgery

## 2018-05-20 DIAGNOSIS — M659 Synovitis and tenosynovitis, unspecified: Principal | ICD-10-CM

## 2018-05-20 MED ORDER — AMOXICILLIN 875 MG-POTASSIUM CLAVULANATE 125 MG TABLET
ORAL_TABLET | Freq: Two times a day (BID) | ORAL | 0 refills | 0.00000 days | Status: SS
Start: 2018-05-20 — End: 2018-06-06

## 2018-05-22 MED ORDER — QUETIAPINE 100 MG TABLET
ORAL_TABLET | Freq: Every evening | ORAL | 0 refills | 0 days | Status: CP
Start: 2018-05-22 — End: 2018-06-09

## 2018-05-30 ENCOUNTER — Ambulatory Visit
Admit: 2018-05-30 | Discharge: 2018-05-31 | Payer: MEDICAID | Attending: Orthopaedic Surgery | Primary: Orthopaedic Surgery

## 2018-05-30 DIAGNOSIS — M659 Synovitis and tenosynovitis, unspecified: Principal | ICD-10-CM

## 2018-06-03 DIAGNOSIS — M25642 Stiffness of left hand, not elsewhere classified: Principal | ICD-10-CM

## 2018-06-03 DIAGNOSIS — M659 Synovitis and tenosynovitis, unspecified: Secondary | ICD-10-CM

## 2018-06-04 ENCOUNTER — Ambulatory Visit: Admit: 2018-06-04 | Discharge: 2018-06-05 | Payer: MEDICAID

## 2018-06-04 DIAGNOSIS — N649 Disorder of breast, unspecified: Principal | ICD-10-CM

## 2018-06-04 DIAGNOSIS — M659 Synovitis and tenosynovitis, unspecified: Principal | ICD-10-CM

## 2018-06-05 ENCOUNTER — Ambulatory Visit
Admit: 2018-06-05 | Discharge: 2018-06-06 | Payer: MEDICAID | Attending: Orthopaedic Surgery | Primary: Orthopaedic Surgery

## 2018-06-05 DIAGNOSIS — S61253A Open bite of left middle finger without damage to nail, initial encounter: Principal | ICD-10-CM

## 2018-06-05 DIAGNOSIS — M659 Synovitis and tenosynovitis, unspecified: Principal | ICD-10-CM

## 2018-06-06 ENCOUNTER — Ambulatory Visit: Admit: 2018-06-06 | Discharge: 2018-06-06 | Payer: MEDICAID

## 2018-06-06 ENCOUNTER — Encounter: Admit: 2018-06-06 | Discharge: 2018-06-06 | Payer: MEDICAID | Attending: Registered Nurse | Primary: Registered Nurse

## 2018-06-06 DIAGNOSIS — S61253A Open bite of left middle finger without damage to nail, initial encounter: Principal | ICD-10-CM

## 2018-06-06 MED ORDER — AMOXICILLIN 875 MG-POTASSIUM CLAVULANATE 125 MG TABLET
ORAL_TABLET | Freq: Two times a day (BID) | ORAL | 0 refills | 0.00000 days
Start: 2018-06-06 — End: 2018-06-10

## 2018-06-06 MED ORDER — ACETAMINOPHEN 325 MG TABLET
ORAL_TABLET | Freq: Four times a day (QID) | ORAL | 0 refills | 0.00000 days | Status: CP | PRN
Start: 2018-06-06 — End: 2018-06-23

## 2018-06-06 MED ORDER — IBUPROFEN 800 MG TABLET
ORAL_TABLET | Freq: Three times a day (TID) | ORAL | 0 refills | 0.00000 days | Status: CP | PRN
Start: 2018-06-06 — End: 2018-06-10

## 2018-06-09 ENCOUNTER — Ambulatory Visit
Admit: 2018-06-09 | Discharge: 2018-06-10 | Payer: MEDICAID | Attending: Psychiatric/Mental Health | Primary: Psychiatric/Mental Health

## 2018-06-09 DIAGNOSIS — F329 Major depressive disorder, single episode, unspecified: Principal | ICD-10-CM

## 2018-06-09 DIAGNOSIS — G479 Sleep disorder, unspecified: Secondary | ICD-10-CM

## 2018-06-09 MED ORDER — PRAZOSIN 5 MG CAPSULE
ORAL_CAPSULE | 1 refills | 0 days | Status: CP
Start: 2018-06-09 — End: 2018-09-26

## 2018-06-09 MED ORDER — QUETIAPINE 200 MG TABLET
ORAL_TABLET | Freq: Every evening | ORAL | 1 refills | 0 days | Status: CP
Start: 2018-06-09 — End: 2018-06-19

## 2018-06-09 MED ORDER — GABAPENTIN 300 MG CAPSULE
ORAL_CAPSULE | 0 refills | 0 days | Status: CP
Start: 2018-06-09 — End: 2018-06-23

## 2018-06-10 ENCOUNTER — Ambulatory Visit: Admit: 2018-06-10 | Discharge: 2018-06-11 | Payer: MEDICAID | Attending: Family Medicine | Primary: Family Medicine

## 2018-06-10 DIAGNOSIS — R635 Abnormal weight gain: Secondary | ICD-10-CM

## 2018-06-10 DIAGNOSIS — M659 Synovitis and tenosynovitis, unspecified: Secondary | ICD-10-CM

## 2018-06-10 DIAGNOSIS — S61412D Laceration without foreign body of left hand, subsequent encounter: Secondary | ICD-10-CM

## 2018-06-10 DIAGNOSIS — E033 Postinfectious hypothyroidism: Principal | ICD-10-CM

## 2018-06-10 MED ORDER — IBUPROFEN 800 MG TABLET
ORAL_TABLET | Freq: Three times a day (TID) | ORAL | 0 refills | 0 days | Status: CP | PRN
Start: 2018-06-10 — End: 2019-02-17

## 2018-06-11 MED ORDER — LEVOTHYROXINE 100 MCG TABLET
ORAL_TABLET | Freq: Every day | ORAL | 0 refills | 0 days | Status: CP
Start: 2018-06-11 — End: 2018-07-15

## 2018-06-12 ENCOUNTER — Ambulatory Visit
Admit: 2018-06-12 | Discharge: 2018-06-12 | Payer: MEDICAID | Attending: Orthopaedic Surgery | Primary: Orthopaedic Surgery

## 2018-06-12 ENCOUNTER — Ambulatory Visit: Admit: 2018-06-12 | Discharge: 2018-06-12 | Payer: MEDICAID | Attending: Community Health | Primary: Community Health

## 2018-06-12 DIAGNOSIS — N179 Acute kidney failure, unspecified: Secondary | ICD-10-CM

## 2018-06-12 DIAGNOSIS — E033 Postinfectious hypothyroidism: Secondary | ICD-10-CM

## 2018-06-12 DIAGNOSIS — M65149 Other infective (teno)synovitis, unspecified hand: Principal | ICD-10-CM

## 2018-06-12 DIAGNOSIS — R609 Edema, unspecified: Principal | ICD-10-CM

## 2018-06-12 MED ORDER — AMOXICILLIN 875 MG-POTASSIUM CLAVULANATE 125 MG TABLET
ORAL_TABLET | Freq: Two times a day (BID) | ORAL | 0 refills | 0 days | Status: CP
Start: 2018-06-12 — End: 2018-06-26

## 2018-06-19 ENCOUNTER — Ambulatory Visit: Admit: 2018-06-19 | Discharge: 2018-06-20 | Payer: MEDICAID | Attending: Community Health | Primary: Community Health

## 2018-06-19 ENCOUNTER — Ambulatory Visit
Admit: 2018-06-19 | Discharge: 2018-06-20 | Payer: MEDICAID | Attending: Orthopaedic Surgery | Primary: Orthopaedic Surgery

## 2018-06-19 ENCOUNTER — Ambulatory Visit: Admit: 2018-06-19 | Discharge: 2018-06-20 | Payer: MEDICAID

## 2018-06-19 DIAGNOSIS — M65142 Other infective (teno)synovitis, left hand: Principal | ICD-10-CM

## 2018-06-19 DIAGNOSIS — M659 Synovitis and tenosynovitis, unspecified: Principal | ICD-10-CM

## 2018-06-23 ENCOUNTER — Ambulatory Visit: Admit: 2018-06-23 | Discharge: 2018-06-24 | Payer: MEDICAID | Attending: Community Health | Primary: Community Health

## 2018-06-23 DIAGNOSIS — N179 Acute kidney failure, unspecified: Secondary | ICD-10-CM

## 2018-06-23 DIAGNOSIS — F321 Major depressive disorder, single episode, moderate: Secondary | ICD-10-CM

## 2018-06-23 DIAGNOSIS — M79606 Pain in leg, unspecified: Secondary | ICD-10-CM

## 2018-06-23 DIAGNOSIS — N189 Chronic kidney disease, unspecified: Secondary | ICD-10-CM

## 2018-06-23 DIAGNOSIS — F419 Anxiety disorder, unspecified: Principal | ICD-10-CM

## 2018-06-23 DIAGNOSIS — F5101 Primary insomnia: Secondary | ICD-10-CM

## 2018-06-23 MED ORDER — BUPROPION HCL SR 100 MG TABLET,12 HR SUSTAINED-RELEASE
ORAL_TABLET | Freq: Two times a day (BID) | ORAL | 2 refills | 0.00000 days | Status: CP
Start: 2018-06-23 — End: 2018-08-07

## 2018-06-23 MED ORDER — ARIPIPRAZOLE 15 MG TABLET
ORAL_TABLET | Freq: Every day | ORAL | 3 refills | 0.00000 days | Status: CP
Start: 2018-06-23 — End: 2018-09-26

## 2018-06-23 MED ORDER — MELATONIN 5 MG CAPSULE
Freq: Every evening | ORAL | 2 refills | 0.00000 days | Status: CP
Start: 2018-06-23 — End: 2019-04-20

## 2018-06-23 MED ORDER — GABAPENTIN 300 MG CAPSULE
ORAL_CAPSULE | 11 refills | 0 days | Status: CP
Start: 2018-06-23 — End: ?

## 2018-06-26 ENCOUNTER — Ambulatory Visit
Admit: 2018-06-26 | Discharge: 2018-06-27 | Payer: MEDICAID | Attending: Orthopaedic Surgery | Primary: Orthopaedic Surgery

## 2018-06-26 DIAGNOSIS — M659 Synovitis and tenosynovitis, unspecified: Principal | ICD-10-CM

## 2018-07-07 ENCOUNTER — Ambulatory Visit: Admit: 2018-07-07 | Discharge: 2018-07-08 | Payer: MEDICAID | Attending: Community Health | Primary: Community Health

## 2018-07-07 DIAGNOSIS — F321 Major depressive disorder, single episode, moderate: Principal | ICD-10-CM

## 2018-07-07 DIAGNOSIS — F419 Anxiety disorder, unspecified: Secondary | ICD-10-CM

## 2018-07-10 ENCOUNTER — Ambulatory Visit
Admit: 2018-07-10 | Discharge: 2018-07-11 | Payer: MEDICAID | Attending: Orthopaedic Surgery | Primary: Orthopaedic Surgery

## 2018-07-10 DIAGNOSIS — M659 Synovitis and tenosynovitis, unspecified: Principal | ICD-10-CM

## 2018-07-14 ENCOUNTER — Ambulatory Visit: Admit: 2018-07-14 | Discharge: 2018-07-14 | Payer: MEDICAID | Attending: "Endocrinology | Primary: "Endocrinology

## 2018-07-14 DIAGNOSIS — E038 Other specified hypothyroidism: Principal | ICD-10-CM

## 2018-07-14 DIAGNOSIS — E063 Autoimmune thyroiditis: Secondary | ICD-10-CM

## 2018-07-14 DIAGNOSIS — E039 Hypothyroidism, unspecified: Secondary | ICD-10-CM

## 2018-07-15 MED ORDER — LEVOTHYROXINE 125 MCG TABLET
ORAL_TABLET | Freq: Every day | ORAL | 3 refills | 0 days | Status: CP
Start: 2018-07-15 — End: 2018-08-08

## 2018-07-23 ENCOUNTER — Ambulatory Visit
Admit: 2018-07-23 | Discharge: 2018-07-24 | Payer: MEDICAID | Attending: Student in an Organized Health Care Education/Training Program | Primary: Student in an Organized Health Care Education/Training Program

## 2018-07-23 DIAGNOSIS — N179 Acute kidney failure, unspecified: Secondary | ICD-10-CM

## 2018-07-23 DIAGNOSIS — B182 Chronic viral hepatitis C: Principal | ICD-10-CM

## 2018-07-23 DIAGNOSIS — F1990 Other psychoactive substance use, unspecified, uncomplicated: Secondary | ICD-10-CM

## 2018-07-23 MED ORDER — GLECAPREVIR 100 MG-PIBRENTASVIR 40 MG TABLET
ORAL_TABLET | Freq: Every day | ORAL | 1 refills | 0 days | Status: CP
Start: 2018-07-23 — End: 2018-07-23

## 2018-07-29 ENCOUNTER — Ambulatory Visit
Admit: 2018-07-29 | Discharge: 2018-08-16 | Payer: MEDICAID | Attending: Rehabilitative and Restorative Service Providers" | Primary: Rehabilitative and Restorative Service Providers"

## 2018-07-29 DIAGNOSIS — M659 Synovitis and tenosynovitis, unspecified: Principal | ICD-10-CM

## 2018-08-04 DIAGNOSIS — M25642 Stiffness of left hand, not elsewhere classified: Secondary | ICD-10-CM

## 2018-08-04 DIAGNOSIS — M659 Synovitis and tenosynovitis, unspecified: Principal | ICD-10-CM

## 2018-08-05 ENCOUNTER — Ambulatory Visit: Admit: 2018-08-05 | Discharge: 2018-08-05 | Payer: MEDICAID | Attending: Gastroenterology | Primary: Gastroenterology

## 2018-08-05 DIAGNOSIS — B182 Chronic viral hepatitis C: Principal | ICD-10-CM

## 2018-08-07 ENCOUNTER — Ambulatory Visit: Admit: 2018-08-07 | Discharge: 2018-08-08 | Payer: MEDICAID | Attending: Community Health | Primary: Community Health

## 2018-08-07 DIAGNOSIS — J4520 Mild intermittent asthma, uncomplicated: Secondary | ICD-10-CM

## 2018-08-07 DIAGNOSIS — F321 Major depressive disorder, single episode, moderate: Secondary | ICD-10-CM

## 2018-08-07 DIAGNOSIS — E039 Hypothyroidism, unspecified: Principal | ICD-10-CM

## 2018-08-07 MED ORDER — BUPROPION HCL SR 150 MG TABLET,12 HR SUSTAINED-RELEASE
ORAL_TABLET | Freq: Two times a day (BID) | ORAL | 11 refills | 0 days | Status: CP
Start: 2018-08-07 — End: 2018-09-26

## 2018-08-07 MED ORDER — ALBUTEROL SULFATE HFA 90 MCG/ACTUATION AEROSOL INHALER
Freq: Four times a day (QID) | RESPIRATORY_TRACT | 11 refills | 0 days | Status: CP | PRN
Start: 2018-08-07 — End: 2019-08-07

## 2018-08-08 MED ORDER — LEVOTHYROXINE 150 MCG TABLET
ORAL_TABLET | Freq: Every day | ORAL | 11 refills | 0 days | Status: CP
Start: 2018-08-08 — End: 2019-04-21

## 2018-08-12 DIAGNOSIS — M25642 Stiffness of left hand, not elsewhere classified: Secondary | ICD-10-CM

## 2018-08-12 DIAGNOSIS — M659 Synovitis and tenosynovitis, unspecified: Principal | ICD-10-CM

## 2018-08-18 ENCOUNTER — Ambulatory Visit
Admit: 2018-08-18 | Discharge: 2018-08-19 | Payer: MEDICAID | Attending: Orthopaedic Surgery | Primary: Orthopaedic Surgery

## 2018-08-18 DIAGNOSIS — M659 Synovitis and tenosynovitis, unspecified: Principal | ICD-10-CM

## 2018-08-26 ENCOUNTER — Ambulatory Visit
Admit: 2018-08-26 | Discharge: 2018-09-15 | Payer: MEDICAID | Attending: Rehabilitative and Restorative Service Providers" | Primary: Rehabilitative and Restorative Service Providers"

## 2018-08-26 DIAGNOSIS — M25642 Stiffness of left hand, not elsewhere classified: Principal | ICD-10-CM

## 2018-08-26 DIAGNOSIS — M659 Synovitis and tenosynovitis, unspecified: Secondary | ICD-10-CM

## 2018-08-28 MED ORDER — GLECAPREVIR 100 MG-PIBRENTASVIR 40 MG TABLET
ORAL_TABLET | Freq: Every day | ORAL | 1 refills | 0 days | Status: CP
Start: 2018-08-28 — End: ?
  Filled 2018-09-18: qty 84, 28d supply, fill #0

## 2018-08-28 NOTE — Unmapped (Signed)
Fibroscan returned with minimal to no fibrosis. Spoke with [name] and informed of result. We will start Mavyret for 8 weeks of treatment. Counseled her on potential side effects and again discussed ability to potentiate aripirazole effect leading to more drowsiness (reviewed with ID pharmacist who felt reasonable to still proceed without medication adjustment). Instructed her to notify us if drowsiness is significant or severe. [facility] and follow up ~4 weeks after start for lab check.

## 2018-08-29 NOTE — Unmapped (Signed)
Per test claim for Mavyert at the [facility], patient needs Medication Assistance Program for Prior Authorization.

## 2018-09-03 NOTE — Unmapped (Signed)
A Medicaid prior authorization request was submitted for the patient to reflect the following:    09/05/2018 - 09/22/2018; 3 units    Goal # Primary Treatment Goal(s)  (Targeted for Achievement in this Authorization Period)  In 3 weeks, pt will increase RF PIP extension to 55 degrees or less in order to increase functional use of affected extremity.  In 3 weeks, pt will demonstrate LF MP flexion greater than or equal to 60 degrees to max her ability to grasp and release daily living tools.

## 2018-09-04 DIAGNOSIS — M25642 Stiffness of left hand, not elsewhere classified: Principal | ICD-10-CM

## 2018-09-04 DIAGNOSIS — M659 Synovitis and tenosynovitis, unspecified: Secondary | ICD-10-CM

## 2018-09-04 NOTE — Unmapped (Signed)
OUTPATIENT OCCUPATIONAL THERAPY TREATMENT NOTE     Patient Name: Shannon Washington  Date of Birth:[birth year]  Date: 09/04/2018  Visit #: 5 total; 09/05/2018 - 09/22/2018; 3 units  Referring Provider: Waylan Boga, Jamesetta Geralds  Date of Onset: Date of surgery: 06/06/2018  Per referring provider's note:   ASSESSMENT:  Shannon Washington is a 42 y.o. female status post left long finger irrigation debridement of flexor tendon sheath  Date of surgery: 06/06/2018  PLAN:  We had a positive discussion with the patient and her mother in clinic today.  She returns 4 weeks after irrigation and debridement of her left long finger Lexer tendon sheath.  She has been compliant with her Augmentin, erythema and pain of left long finger has since improved significantly.  Since last being seen she was seen by an infectious disease doctor, who agreed that 1 additional week of Augmentin would be appropriate.  She does not require any antibiotics after completing this last prescription of Augmentin.  We will see her back in clinic 2 weeks after completing Augmentin therapy.  Once again we discussed the likelihood that she will require Shannon Washington of her left long finger at some point in the future, after her soft tissue infection has resolved she was in agreement with this plan and all questions were answered.    Insurance Certification Information dates: 09/05/2018 - 09/22/2018; 3 units  Encounter Diagnoses   Name Primary?   ??? Flexor tenosynovitis of finger, sequela Yes   ??? Decreased range of motion of finger of left hand      ASSESSMENT  Pt demonstrates good compliance with orthotic wear however remains limited by high levels of pain and LF contracture, limiting her ability to wash her dishes.  Pt requires continued OP OT to address the above deficits and maximize IND in home environment.      Short Term Goals:  1. In 1 session, patient will perform home exercise program with need for cuing to max IND with ADLs and IADLs. (met)  2. In 1 session, patient will demonstrate independent donning and doffing of orthotic to max joint integrity necessary for ADL completion. (met)  3. In 1 session, patient will verbalize proper care of orthotic and skin to max joint integrity necessary for ADL completion. (met)    Long Term Goals:   1. In 12 weeks, patient will perform upgraded home exercise program, to include progression to strengthening, independently  to max IND with ADLs and IADLs.  2. In 12 weeks, pt will demo functional fist to max IND with grasp and release of daily living tools.   3. In 12 weeks, pt will reduce score on Quick DASH to 10.1% to increase participation in ADLs and IADLs.  4. In 12 weeks, pt will perform dog grooming activities with no report of pain in the LF.  6. In 12 weeks, pt will increase RF PIP extension to 20* or less in order to increase functional use of affected extremity.    PLAN  Continue to progress toward long term goals on plan of care.    Precautions: none    SUBJECTIVE  Patient reports that her cast fell off 5 days after it was applied.     Pain Level: 0/10 at rest; 7/10 L LF with active use    OBJECTIVE   Orthotic Fit/Management (15 min):  Fabricated relative flexion orthosis, with goal of increasing MP flexion.  Pt educated on splint wearing protocol, including during functional activities.  Pt also  educated on the need to perform frequent skin checks to ensure that there is no skin break down.  Pt educated on proper care of orthosis, including cleaning procedures and advising to stay away from heat sources.  Pt advised that if there are any remaining questions regarding the orthosis, they are encouraged to contact us.  Educated pt on and demonstrated proper donning and doffing of the orthosis.       07/29/18 08/04/18 08/12/18 08/26/18 09/04/18   LF AROM MP:  PIP: 61/81  DIP: 0/38 (pre heat and stretch)   MP: 48  PIP: -52/82  DIP: 0/32  (post heat and stretch)   MP: 0/50   PIP: 48/90  DIP: 0/44 (pre heat and stretch)   MP: 0/53 PIP: -66/82  DIP: 0/40  (post heat and stretch)   PIP: -62 ext (pre heat and stretch)   MP: 0/46  PIP: -64/90  DIP: 0/42 (pre heat and stretch)   MP: 0/56  PIP: -69/88  DIP: 0/43   L wrist ext/flex  (L) 70/42  (R): 70/54                Treatment Rendered:  Orthotic Management/Training: 15 min  Pt seen 1:1 OP OT    Total Treatment Time: 15 minutes    Patient Education: Throughout the session, Shannon Washington was educated regarding the following: Disease Process   Therapeutic Exercise  Splinting. female demonstrated and verbalized agreement and understanding.    SignedLinwood Dibbles, OT  09/04/2018,  1:42 PM

## 2018-09-05 NOTE — Unmapped (Signed)
[facility]  [address] [place], [place] [address]    [phone]    This note is to let you know that Davene Costain did not show for their scheduled Occupational Therapy follow-up session.  Pt called to cancel on the day of appointment due to illness. Please contact me if you have any questions or concerns.    Thank you for this referral,     Signed: Lorn Junes, OT  09/05/2018 1:52 PM

## 2018-09-08 ENCOUNTER — Ambulatory Visit: Admit: 2018-09-08 | Discharge: 2018-09-09 | Payer: MEDICAID | Attending: "Endocrinology | Primary: "Endocrinology

## 2018-09-08 DIAGNOSIS — E038 Other specified hypothyroidism: Principal | ICD-10-CM

## 2018-09-08 DIAGNOSIS — E063 Autoimmune thyroiditis: Secondary | ICD-10-CM

## 2018-09-08 LAB — THYROID STIMULATING HORMONE: Thyrotropin:ACnc:Pt:Ser/Plas:Qn:: 7.181 — ABNORMAL HIGH

## 2018-09-08 LAB — FREE T4: Thyroxine.free:MCnc:Pt:Ser/Plas:Qn:: 1.28

## 2018-09-08 NOTE — Unmapped (Addendum)
PRIMARY CARE:  Shannon Sheffield, NP.  ??  CHIEF COMPLAINT:  Hashimoto's thyroiditis now hypothyroid.  ??  INTERVAL HISTORY:  [name] is a 42 year old female with past medical history significant for polysubstance abuse including heroin, cocaine and amphetamines, history of streptococcal and cavitary lung lesions and asthma, who returns for follow up of her thyroid hormone replacement.  Since last seen her dose of levothyroxine was increased from 100 mcg daily to 125 mcg daily. She denies any symptoms associated with thyroid hormone excess. She continues to report fatigue, decreased energy level, inability to lose weight, muscle aches and pains, but denies any change in bowel habits, alteration in hair, skin or fingernails.  Patient denies any enlargement of the neck or pain in the neck. She denies any persistent thyroid pain. She does report some difficulty swallowing, but denies any pain with swallowing or change in her voice.  The patient is otherwise without complaints today.  ??  PAST MEDICAL HISTORY: Reviewed and unchanged since last visit.  ??  FAMILY HISTORY:  Reviewed and unchanged since last visit.  ??  SOCIAL HISTORY: Reviewed and unchanged since last visit.  ??  ALLERGIES:  Ginger causes hives.  ??  CURRENT MEDICATIONS:  Albuterol inhaler as needed, Abilify 15 mg daily, Wellbutrin 100 mg twice daily, Neurontin 600 mg every morning and 900 mg at 12:00 noon and bedtime, ibuprofen as needed, levothyroxine 125 mcg daily, melatonin nightly methadone 10 mg and prazosin 5 mg at bedtime.  ??  REVIEW OF SYSTEMS:  Ten review of systems essentially negative except for as documented in HPI.  ??  PHYSICAL EXAMINATION:  VITAL SIGNS:  Pulse 70, blood pressure 109/71, height 65 inches, weight 237 pounds. BMI 38.3.  GENERAL:  Patient well-developed, obese female, in no acute distress.  ENT: Oropharynx clear without erythema or exudates.  It was noted patient had a narrow posterior oral aperture. Neck supple.  Trachea is in the midline.  Thyroid gland is palpable, not grossly enlarged.  Both lobes of thyroid gland symmetric in size, smooth in character, estimated weight of gland approximately 20-25 g.  Did not appreciate any nodularity of thyroid.  RESPIRATORY:  Lungs clear to auscultation bilaterally.  CARDIOVASCULAR:  Regular rate and rhythm, S1 and S2, without murmurs, nor ectopy.  GASTROINTESTINAL:  Normoactive bowel sounds, soft, protuberant.  LYMPHATICS:  There is no cervical lymphadenopathy.  ??  LABS: Pending.  ??  ASSESSMENT AND PLAN:  Hashimoto's thyroiditis now hypothyroid.  Clinically, the patient has had no change in symptoms.  Will obtain today a TSH and free T4 today and make changes in levothyroxine dose based upon the results.  Will titrate the patient's levothyroxine to achieve a TSH between 1-2 microIU/mL. Stressed the importance of taking her thyroid hormone on a daily basis on an empty stomach and waiting 20-30 minutes before eating or drinking anything else.  Will otherwise follow up in 8 weeks' time.    09/09/2018- Labs reviewed from 09/08/2018  TSH 7.181 microIU/mL and free T4 1.28 ng/dL. Recommend increase of levothyroxine dose to 137 mcg daily. However, her PCP might also be changing her dose. Will clarify with PCP who is managing her thyroid hormone replacement before following up or making changes in levothyroxine.

## 2018-09-11 DIAGNOSIS — M25642 Stiffness of left hand, not elsewhere classified: Principal | ICD-10-CM

## 2018-09-11 DIAGNOSIS — M659 Synovitis and tenosynovitis, unspecified: Secondary | ICD-10-CM

## 2018-09-11 NOTE — Unmapped (Signed)
OUTPATIENT OCCUPATIONAL THERAPY TREATMENT NOTE     Patient Name: Shannon Washington  Date of Birth:14-Aug-1976  Date: 09/11/2018  Visit #: 6 total; 09/05/2018 - 09/22/2018; 1/3 units  Referring Provider: Draeger, Jamesetta Geralds  Date of Onset: Date of surgery: 06/06/2018  Per referring provider's note:   ASSESSMENT:  Dariona Halo Laski Montane is a 42 y.o. female status post left long finger irrigation debridement of flexor tendon sheath  Date of surgery: 06/06/2018  PLAN:  We had a positive discussion with the patient and her mother in clinic today.  She returns 4 weeks after irrigation and debridement of her left long finger Lexer tendon sheath.  She has been compliant with her Augmentin, erythema and pain of left long finger has since improved significantly.  Since last being seen she was seen by an infectious disease doctor, who agreed that 1 additional week of Augmentin would be appropriate.  She does not require any antibiotics after completing this last prescription of Augmentin.  We will see her back in clinic 2 weeks after completing Augmentin therapy.  Once again we discussed the likelihood that she will require Tina lysis of her left long finger at some point in the future, after her soft tissue infection has resolved she was in agreement with this plan and all questions were answered.    Insurance Certification Information dates: 09/05/2018 - 09/22/2018; 3 units  Encounter Diagnoses   Name Primary?   ??? Flexor tenosynovitis of finger, sequela Yes   ??? Decreased range of motion of finger of left hand      ASSESSMENT  Pt demonstrates good compliance with orthotic wear however remains limited by high levels of pain and LF contracture, limiting her ability to wash her dishes.  Pt requires continued OP OT to address the above deficits and maximize IND in home environment.      Short Term Goals:  1. In 1 session, patient will perform home exercise program with need for cuing to max IND with ADLs and IADLs. (met)  2. In 1 session, patient will demonstrate independent donning and doffing of orthotic to max joint integrity necessary for ADL completion. (met)  3. In 1 session, patient will verbalize proper care of orthotic and skin to max joint integrity necessary for ADL completion. (met)    Long Term Goals:   1. In 12 weeks, patient will perform upgraded home exercise program, to include progression to strengthening, independently  to max IND with ADLs and IADLs.  2. In 12 weeks, pt will demo functional fist to max IND with grasp and release of daily living tools.   3. In 12 weeks, pt will reduce score on Quick DASH to 10.1% to increase participation in ADLs and IADLs.  4. In 12 weeks, pt will perform dog grooming activities with no report of pain in the LF.  6. In 12 weeks, pt will increase RF PIP extension to 20* or less in order to increase functional use of affected extremity.    PLAN  Continue to progress toward long term goals on plan of care.    Precautions: none    SUBJECTIVE  Patient reports that she is really frustrated about her lack of progress.       Pain Level: 0/10 at rest; 7/10 L LF with active use    OBJECTIVE   Orthotic Fit/Management (50 min):  Fabricated Static progressive MP flexion orthosis and hand based digit extension orthosis.  Pt also educated on the need to perform frequent skin  checks to ensure that there is no skin break down.  Pt educated on proper care of orthosis, including cleaning procedures and advising to stay away from heat sources.  Pt advised that if there are any remaining questions regarding the orthosis, they are encouraged to contact us.  Educated pt on and demonstrated proper donning and doffing of the orthosis.      Discussed with pt that multiple attempts at serial PIP casting has not made much of a difference with PIP extension and therefore it would be best to trial alternative forms of orthotic management.  States that now her RF is starting to contract into flexion.    She states that he has a daily routine of heating her hand then stretching it however her measurements have not changed much.      07/29/18 08/04/18 08/12/18 08/26/18 09/04/18 09/11/18   LF AROM MP:  PIP: 61/81  DIP: 0/38 (pre heat and stretch)   MP: 48  PIP: -52/82  DIP: 0/32  (post heat and stretch)   MP: 0/50   PIP: 48/90  DIP: 0/44 (pre heat and stretch)   MP: 0/53  PIP: -66/82  DIP: 0/40  (post heat and stretch)   PIP: -62 ext (pre heat and stretch)   MP: 0/46  PIP: -64/90  DIP: 0/42 (pre heat and stretch)   MP: 0/56  PIP: -69/88  DIP: 0/43 (pre heat and stretch)   MP: 0/50  PIP: -69/80  DIP: 0/42   L wrist ext/flex  (L) 70/42  (R): 70/54                  Treatment Rendered:  Orthotic Management/Training: 50 min  Pt seen 1:1 OP OT    Total Treatment Time: 50 minutes    Patient Education: Throughout the session, Angee Nevaeh Korte Montane was educated regarding the following: Disease Process   Therapeutic Exercise  Splinting. female demonstrated and verbalized agreement and understanding.    SignedLinwood Dibbles, OT  09/11/2018,  10:30 AM

## 2018-09-15 NOTE — Unmapped (Signed)
[facility] Medication Referral: [place] Approved      Medication (Brand/Generic): Mavyret    Final Test Claim completed with resulted information below:    Patient ABLE to fill at [facility]  Insurance Company:  Medicaid  Anticipated Copay: $3  Is anticipated copay with a copay card or grant? No    Does patient's insurance plan only allow a 15 day supply for the first 6 fills in the Split Fill Program? No  If yes, inform patient they can request to dis-enroll from the [facility] by calling the patient help desk at N/A.      If the copay is under the $25 defined limit, per policy there will be no further investigation of need for financial assistance at this time unless patient requests. This referral has been communicated to the provider and handed off to the [facility] team for further processing and filling of prescribed medication.   ______________________________________________________________________  Please utilize this referral for viewing purposes as it will serve as the central location for all relevant documentation and updates.

## 2018-09-18 MED FILL — MAVYRET 100 MG-40 MG TABLET: 28 days supply | Qty: 84 | Fill #0 | Status: AC

## 2018-09-18 NOTE — Unmapped (Signed)
[facility]   Patient Onboarding/Medication Counseling    [name] is a 42 y.o. female with Hepatitis C who I am counseling today on initiation of therapy.    Medication: Mavyret    Verified patient's date of birth / HIPAA.      Education Provided: ?    Dose/Administration discussed: Take 3 tablets by mouth daily with food. This medication should be taken  with food.  Stressed the importance of taking medication as prescribed and to contact provider if that changes at any time.  Discussed missed dose instructions.    Storage requirements: this medicine should be stored at room temperature.     Side effects / precautions discussed: Discussed common side effects, including fatigue, headache and GI upset. If patient experiences excessive drowsiness or signs of worsening liver function (dark urine, abdominal pain, nausea/vomiting, extreme fatigue, decreased appetite, light colored stools or yellowing of skin/eyes), they need to call the doctor.  Patient will receive a drug information handout with shipment.    Handling precautions / disposal reviewed:  n/a.    Drug Interactions: other medications reviewed and up to date in Epic.  Coadministration of Mavyret and Abilify may increase serum concentrations of Ability. Patient was advised of increased risk of excessive drowsiniess and was counseled to contact the provider if not tolerable..    Comorbidities/Allergies: reviewed and up to date in Epic.    Verified therapy is appropriate and should continue      Delivery Information    Medication Assistance provided: Prior Authorization    Anticipated copay of $3 reviewed with patient. Verified delivery address in Epic.    Scheduled delivery date: September 19, 2018  Medication will be delivered via Next Day Courier to the home address in [place].  This shipment will not require a signature.      Explained the services we provide at [facility] and that each month we would call to set up refills.  Stressed importance of returning phone calls so that we could ensure they receive their medications in time each month.  Informed patient that we should be setting up refills 7-10 days prior to when they will run out of medication.  Informed patient that welcome packet will be sent.      Patient verbalized understanding of the above information as well as how to contact the pharmacy at [phone] option 4 with any questions/concerns.  The pharmacy is open Monday through Friday 8:30am-4:30pm.  A pharmacist is available 24/7 via pager to answer any clinical questions they may have.        Patient Specific Needs      ? Patient has no physical, cognitive, or cultural barriers.    ? Patient prefers to have medications discussed with  Patient     ? Patient is able to read and understand education materials at a high school level or above.    ? Patient's primary language is  English       Shannon Washington, PharmD Candidate 2020    Shannon Washington. [place], [place].[facility]  [phone] option 4

## 2018-09-26 ENCOUNTER — Ambulatory Visit: Admit: 2018-09-26 | Discharge: 2018-09-27 | Payer: MEDICAID | Attending: Community Health | Primary: Community Health

## 2018-09-26 DIAGNOSIS — R768 Other specified abnormal immunological findings in serum: Secondary | ICD-10-CM

## 2018-09-26 DIAGNOSIS — F321 Major depressive disorder, single episode, moderate: Secondary | ICD-10-CM

## 2018-09-26 DIAGNOSIS — E038 Other specified hypothyroidism: Secondary | ICD-10-CM

## 2018-09-26 DIAGNOSIS — G43011 Migraine without aura, intractable, with status migrainosus: Principal | ICD-10-CM

## 2018-09-26 DIAGNOSIS — G479 Sleep disorder, unspecified: Secondary | ICD-10-CM

## 2018-09-26 DIAGNOSIS — G8929 Other chronic pain: Secondary | ICD-10-CM

## 2018-09-26 DIAGNOSIS — F419 Anxiety disorder, unspecified: Secondary | ICD-10-CM

## 2018-09-26 MED ORDER — IBUPROFEN 800 MG TABLET
ORAL_TABLET | Freq: Three times a day (TID) | ORAL | 3 refills | 0.00000 days | Status: CP | PRN
Start: 2018-09-26 — End: 2019-09-26

## 2018-09-26 MED ORDER — ARIPIPRAZOLE 15 MG TABLET
ORAL_TABLET | Freq: Every day | ORAL | 3 refills | 0 days | Status: CP
Start: 2018-09-26 — End: 2019-04-08

## 2018-09-26 MED ORDER — SUMATRIPTAN 50 MG TABLET
ORAL_TABLET | Freq: Once | ORAL | 2 refills | 0 days | Status: CP | PRN
Start: 2018-09-26 — End: 2019-09-27

## 2018-09-26 MED ORDER — PRAZOSIN 5 MG CAPSULE
ORAL_CAPSULE | 11 refills | 0 days | Status: CP
Start: 2018-09-26 — End: ?

## 2018-09-26 MED ORDER — BUPROPION HCL SR 150 MG TABLET,12 HR SUSTAINED-RELEASE
ORAL_TABLET | Freq: Two times a day (BID) | ORAL | 11 refills | 0.00000 days | Status: CP
Start: 2018-09-26 — End: 2019-09-26

## 2018-09-26 NOTE — Unmapped (Signed)
ASSESSMENT/PLAN:    Problem List Items Addressed This Visit        Other    Hepatitis C antibody test positive  Patient is currently on Mavyret therapy and tolerating well. Not sure if HA is related    Sleep disorder will refill minipress and patient states this greatly helps with her nightmares    Relevant Medications    prazosin (MINIPRESS) 5 MG capsule    Anxiety continue current regimen, patient doing well today    Relevant Medications    ARIPiprazole (ABILIFY) 15 MG tablet      Other Visit Diagnoses     Other chronic pain    -  Primary    Relevant Medications    ibuprofen (MOTRIN) 800 MG tablet    Intractable migraine without aura and with status migrainosus    Will try Imitrex for HA as they seem migraine in nature patent will let me know how this is working    Relevant Medications    SUMAtriptan (IMITREX) 50 MG tablet    ibuprofen (MOTRIN) 800 MG tablet    Current moderate episode of major depressive disorder, unspecified whether recurrent (CMS-HCC)    Doing well on current regimen    Relevant Medications    buPROPion (WELLBUTRIN SR) 150 MG 12 hr tablet    ARIPiprazole (ABILIFY) 15 MG tablet    Class 3 severe obesity due to excess calories without serious comorbidity in adult, unspecified BMI (CMS-HCC)    Patient is interested in taking a Diet Pill  Advised we need to get her Hypothyroidism under stable control prior to trying any weight loss agents.    Other specified hypothyroidism    Will defer to Endocrine for medication management she is currently on Synthroid 150 mcg daily with a recent  TSH of 7.181          Chief Complaint   Patient presents with   ??? Follow-up       SUBJECTIVE:    [name] is a 43 y.o. female that presents to clinic today regarding the following issues: she presents for a follow up visit, she states she has been having severe headaches approx once a week.  She states they are associated with nausea and light sensitivity and states it feels like the top of her head is going to blow off .  She has to lay in a dark room for several hours and takes Ibuprofen for them to resolve. Has night terrors that are relieved by minipress, but states she has run out of them.  She saw Endocrine a couple of weeks ago and had a TSH level checked.  She states she has felt more fatigued lately. She is currently on Synthriod 150 mcg.      Review of symptoms:  Positive for fatigue, weight gain, headaches, nausea.nightmares, Negative for depression, anxiety, vomiting, diarrhea, fever, chills, CP, SOB    I have reviewed the patients problem list, medical history, surgical history, laboratory history and recent hospitalizations, current medications, allergies, and social history and updated them as needed.    [name]  reports that she quit smoking about 6 months ago. Her smoking use included cigarettes. She started smoking about 21 years ago. She smoked 0.25 packs per day. She has never used smokeless tobacco.    OBJECTIVE:    Wt Readings from Last 3 Encounters:   09/26/18 (!) 108.2 kg (238 lb 9.6 oz)   09/08/18 (!) 107.5 kg (237 lb)   08/07/18 (!) 107.5 kg (  237 lb)       [name]  weight is 108.2 kg (238 lb 9.6 oz) (abnormal). Her oral temperature is 37 ??C (98.6 ??F). Her blood pressure is 99/72 and her pulse is 72.   Gen: Pleasant obese female accompanied by her mom in NAD  EENT: PERRLA, EOMI, TM clear bilaterally, MMM, no posterior pharynx erythema. Small right thyriod nodule palpated,  No cervical LAD  CV: Normal S1 S2 no m/r/g  Resp: CTAB  GI: obese, Soft, NTND +BS, No Hepatosplenomegaly  Msk: No obvious deformities or swelling  Ext:  Pulses are palpable +3 X all four extremities, no swelling  Neuro:  Grossly intact  Skin: warm and dry without evidence of suspicious lesions.  Psych:  Mood is good, able to carry on normal conversation without evidence of anxiety or depression

## 2018-09-26 NOTE — Unmapped (Addendum)
I am going to start you Imitrex for migraines    I have sent a note to your Endocrinologist re your TSH level    I would like to see you back in 3 months, sooner if needed

## 2018-09-29 NOTE — Unmapped (Signed)
[facility]  [address] [place], [place] [address]    [phone]    This note is to let you know that Shannon Washington did not show for their scheduled Occupational Therapy follow-up session.  Please contact me if you have any questions or concerns.    Thank you for this referral,     Signed: Linwood Dibbles, OT  09/29/2018 1:17 PM

## 2018-09-29 NOTE — Unmapped (Deleted)
[facility]  [address] [place] [place], [place] [address]    [phone]    This note is to let you know that Shannon Washington did not show for their scheduled Occupational Therapy follow-up session.  Please contact me if you have any questions or concerns.    Thank you for this referral,     Signed: Linwood Dibbles, OT  09/29/2018 12:51 PM

## 2018-10-06 NOTE — Unmapped (Signed)
[facility]  [address] [place], [place] [address]    [phone]    This note is to let you know that Shannon Washington did not show for their scheduled Occupational Therapy follow-up session.  Please contact me if you have any questions or concerns.    Thank you for this referral,     Signed: Linwood Dibbles, OT  10/06/2018 12:59 PM

## 2018-10-06 NOTE — Unmapped (Signed)
[facility]  [address] [place], [place] [address]    [phone]    This note is to let you know that Shannon Washington did not show for their scheduled Occupational Therapy follow-up session.  Please contact me if you have any questions or concerns.    Thank you for this referral,     Signed: Linwood Dibbles, OT  10/06/2018 12:46 PM

## 2018-10-08 NOTE — Unmapped (Signed)
Shannon Washington 's MAVYRET shipment will be delayed due to Refill too soon until 1/16 We have contacted the patient and left a message We will wait for a call back from the patient/caregiver to reschedule the delivery.  We have confirmed the delivery date as N/A .

## 2018-10-08 NOTE — Unmapped (Signed)
[facility] Refill and Clinical Coordination Note  Medication(s): Mavyret    Shannon Washington, DOB: November 13, 1975  Phone: [phone] (home) [phone] (work), Alternate phone contact: N/A  Shipping address: [address]  [place] [address]  Phone or address changes today?: No  All above HIPAA information verified.  Insurance changes? No    Completed refill and clinical call assessment today to schedule patient's medication shipment from the [facility] [phone]).      MEDICATION RECONCILIATION    Confirmed the medication and dosage are correct and have not changed: Yes, regimen is correct and unchanged.    Were there any changes to your medication(s) in the past month:  No, there are no changes reported at this time.    ADHERENCE    Is this medicine transplant or covered by Medicare Part B? No.     Mavyret: Patient has 21 tablets  on hand.    Did you miss any doses in the past 4 weeks? No missed doses reported.  Adherence counseling provided? Not needed     SIDE EFFECT MANAGEMENT    Are you tolerating your medication?:  Shannon Washington reports tolerating the medication.  Side effect management discussed: None      Therapy is appropriate and should be continued.    Evidence of clinical benefit: Do you feel that that the medication is helping? Yes      FINANCIAL/SHIPPING    Delivery Scheduled: Yes, Expected medication delivery date: 10/09/2018     Medication will be delivered via Next Day Courier to the home address in [facility]    Additional medications refilled: No additional medications/refills needed at this time.    The patient will receive a drug information handout for each medication shipped and additional FDA Medication Guides as required.      Shannon Washington did not have any additional questions at this time.    Delivery address confirmed in Epic.     We will follow up with patient monthly for standard refill processing and delivery.      Thank you,  Roderic Palau   [facility] Specialty Pharmacist

## 2018-10-15 NOTE — Unmapped (Signed)
Shannon Washington 's Mavyret shipment will be delayed due to Prior Authorization Required We have contacted the patient and left a message We will call the patient to reschedule the delivery upon resolution. We have confirmed the delivery date as n/a .

## 2018-10-16 MED FILL — MAVYRET 100 MG-40 MG TABLET: 28 days supply | Qty: 84 | Fill #1 | Status: AC

## 2018-10-16 MED FILL — MAVYRET 100 MG-40 MG TABLET: ORAL | 28 days supply | Qty: 84 | Fill #1

## 2018-10-20 ENCOUNTER — Ambulatory Visit
Admit: 2018-10-20 | Discharge: 2018-10-21 | Payer: MEDICAID | Attending: Orthopaedic Surgery | Primary: Orthopaedic Surgery

## 2018-10-20 DIAGNOSIS — M659 Synovitis and tenosynovitis, unspecified: Principal | ICD-10-CM

## 2018-10-20 NOTE — Unmapped (Signed)
Contacts for Dr. Jari Pigg Team:      Clinic Contact: Archie Patten, [place]    [email or web address] [email or web address]   Phone [phone]   Fax [phone]     Surgery Scheduler: Tammy Sours   [phone] (at recording press #3)     Appointments: [phone] (at the recording press the number 1)  Surgery Date:11/11/2018    You need to have the full day available on the day of your surgery.  We do not set surgery times.  The OR nurses will call you the day before your surgery, typically between 2:00-5:00 pm to inform you of what time to arrive for surgery.  If you have not received a call by 5:00 pm please call them at [phone].     If you are on any anticoagulants such as Aspirin, Coumadin, Plavix, Xeralto, etc. please let us know so we can discuss whether you should abstain from it one week prior to surgery.     You will bleed less during surgery if you stop taking any anti-inflammatory medications at least one week prior to surgery.  These medications include: Ibuprofen (Advil, Motrin,) Naproxyn (Naprosyn, Aleve,) Indomethacin (Indocin,) and several other prescription anti-inflammatories commonly prescribed for arthritis or other musculoskeletal conditions. Please discuss stopping any anti-inflammatory medication with [place] or your primary care doctor. Acetaminophen (Tylenol) is a good pain reliever choice that does not increase bleeding to consider as long as you do not have any previous liver damage.  *Do not stop your aspirin if it is taken to prevent a stroke or heart attack.     Do not eat or drink anything after midnight the night before your surgery unless instructed otherwise.  If you are diabetic, check with your primary care physician for special instructions concerning your insulin dosage prior to surgery.  Do not take your morning medications unless specifically instructed, typically only medications that control the heart rate.    Do not wear any deodorant the day of surgery.  We recommend that you wear comfortable loose fitting cloths that will not be restrictive to your surgical site.  Please leave jewelry, credit cards, cash, and other valuables at home.  Do not wear any piercings, nail polish, make-up, or metal hair clips the day of your surgery.  Contact lenses, glasses, and dentures must be removed before surgery. Please bring a case to store them in during surgery.     Any patient under the age of 67 must be accompanied by their parent or legal guardian who must stay at the surgery center from the time of arrival until the time of discharge.     When you are ready to be discharged home from your surgery you must have an adult to take you home.  It will not be safe for you to drive or take public transportation alone.

## 2018-10-22 ENCOUNTER — Ambulatory Visit
Admit: 2018-10-22 | Discharge: 2018-10-23 | Payer: MEDICAID | Attending: Student in an Organized Health Care Education/Training Program | Primary: Student in an Organized Health Care Education/Training Program

## 2018-10-22 DIAGNOSIS — B182 Chronic viral hepatitis C: Principal | ICD-10-CM

## 2018-10-22 LAB — CREATININE
CREATININE: 1.09 mg/dL — ABNORMAL HIGH (ref 0.60–1.00)
EGFR CKD-EPI AA FEMALE: 71 mL/min/{1.73_m2} (ref >=60)

## 2018-10-22 LAB — CBC W/ AUTO DIFF
BASOPHILS ABSOLUTE COUNT: 0.1 10*9/L (ref 0.0–0.1)
BASOPHILS RELATIVE PERCENT: 0.7 %
EOSINOPHILS ABSOLUTE COUNT: 0.3 10*9/L (ref 0.0–0.4)
EOSINOPHILS RELATIVE PERCENT: 3.7 %
HEMATOCRIT: 43.4 % (ref 36.0–46.0)
HEMOGLOBIN: 13.9 g/dL (ref 12.0–16.0)
LARGE UNSTAINED CELLS: 2 % (ref 0–4)
LYMPHOCYTES ABSOLUTE COUNT: 3.2 10*9/L (ref 1.5–5.0)
LYMPHOCYTES RELATIVE PERCENT: 40.3 %
MEAN CORPUSCULAR HEMOGLOBIN CONC: 32.2 g/dL (ref 31.0–37.0)
MEAN CORPUSCULAR VOLUME: 96 fL (ref 80.0–100.0)
MEAN PLATELET VOLUME: 7.6 fL (ref 7.0–10.0)
MONOCYTES ABSOLUTE COUNT: 0.5 10*9/L (ref 0.2–0.8)
MONOCYTES RELATIVE PERCENT: 6.7 %
NEUTROPHILS ABSOLUTE COUNT: 3.7 10*9/L (ref 2.0–7.5)
NEUTROPHILS RELATIVE PERCENT: 46.3 %
PLATELET COUNT: 322 10*9/L (ref 150–440)
RED BLOOD CELL COUNT: 4.52 10*12/L (ref 4.00–5.20)
RED CELL DISTRIBUTION WIDTH: 14.9 % (ref 12.0–15.0)
WBC ADJUSTED: 8 10*9/L (ref 4.5–11.0)

## 2018-10-22 LAB — HEPATIC FUNCTION PANEL
ALBUMIN: 4.1 g/dL (ref 3.5–5.0)
ALKALINE PHOSPHATASE: 131 U/L — ABNORMAL HIGH (ref 38–126)
ALT (SGPT): 23 U/L (ref <35)
AST (SGOT): 25 U/L (ref 14–38)

## 2018-10-22 LAB — BASOPHILS RELATIVE PERCENT: Lab: 0.7

## 2018-10-22 LAB — EGFR CKD-EPI NON-AA FEMALE: Lab: 62

## 2018-10-22 LAB — ALKALINE PHOSPHATASE: Alkaline phosphatase:CCnc:Pt:Ser/Plas:Qn:: 131 — ABNORMAL HIGH

## 2018-10-22 NOTE — Unmapped (Signed)
.  ORTHOPAEDIC RETURN CLINIC NOTE     ASSESSMENT:  Shannon Washington is a 43 y.o. female status post left long finger irrigation debridement of flexor tendon sheath  Date of surgery: 06/06/2018    PLAN:  We had a positive discussion with the patient in clinic today.  The patient has been working very hard in the last 6 months and surgery with hand therapy and on her own to improve range of motion.  At her last visit we discussed the possibility of additional surgery to improve range of motion about the PIP joint once her wound was fully healed.  The patient represents today with marginal improvement in her range of motion would like to discuss surgical management.  We advised the patient that she is a candidate for digit widget application to help improve her extension at the PIP.  We also advised her bills operation may help her range of motion will be difficult for her to regain full range of motion as she has not had it for many years due to an extensive injury sustained many years ago in combination with her recent episode of flexor tenosynovitis the patient would like to move forward with digit widget placement she was consented and scheduled for surgery today.    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  Sensation intact in radial, median, and ulnar distributions. Significant scar tissue at PIP joint.  Patient range of motion at the MCP is 0 to 50 degrees, PIP joint is limited to 80 to 110 degrees, the DIP range of motion limited to 20 to 60 degrees.       IMAGING:  No imaging available

## 2018-10-22 NOTE — Unmapped (Signed)
It was a pleasure to see you today. We discussed:    1. Check lab tests today to confirm that the Hepatitis C has responded appropriately to the anti-viral medication, and to ensure there are no adverse effects.  2. Continue Mavyret to complete 8 weeks total.  3. Return three months after completion of Mavyret (4 months from now) to check to confirm cure.  4. Hepatitis A vaccine today (second dose in 6-12 months from now, can be given by your primary doctor).  5. Call if any questions.       ?? The ID clinic phone number is [phone] (front desk) - ask for Dr. Luiz Ochoa or Lynelle Smoke (nurse) or Dr. Mayford Knife (pharmacist).  ?? The ID clinic fax number is [phone].    For urgent issues on nights and weekends you may reach the ID Physician on call through the [facility] Operator at [phone].

## 2018-10-22 NOTE — Unmapped (Signed)
Standard INFECTIOUS DISEASES CLINIC FOLLOW UP VISIT    ID follow up for chronic hepatitis C    Assessment/Recommendations:  Shannon Washington is 43 y.o. F with recent cat bite associated Pasteurella infection of her left long finger, now improved. Also with chronic HCV, now completed four weeks of Glecapravir-pibrentasvir.  We discussed in depth again what Hepatitis C is, how it causes damage to the liver over time and the risk for reinfection with future exposures. We also discussed the importance of limiting damage to liver from excess alcohol, excessive acetaminophen or via fatty liver disease.    ID Problem List:  # Left middle finger tenosynovitis due to Pasteurella spp (after cat bite) s/p first I+D on 04/21/18 followed by 3 weeks of Ceftriaxone. 2nd I+D on 06/06/18 (negative cultures)-> Augmentin (started 9/19)  #h/o IDU  -On methadone via [facility] in [place].  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  -HAV immunization #1 today - next dose in 6-12 months (can be given by PCP if no longer under our care)  -Check CBC with diff, Cr and LFT for monitoring   -Check HCV RNA     Disposition:  Return to clinic in 4 months for SVR-12.    Immunization History   Administered Date(s) Administered   ??? Influenza Vaccine Quad (IIV4 PF) 24mo+ injectable 11/15/2015, 10/06/2016, 06/10/2018   ??? Influenza Virus Vaccine, unspecified formulation 11/15/2015, 06/19/2017   ??? PNEUMOCOCCAL POLYSACCHARIDE 23 05/22/2017   ??? TdaP 06/10/2018       ________________________________________________________________________________________  CC: Follow-up for finger infection and Hepatitis C.    HPI/Interim Events:  43 y.o. F with h/o TV endocarditis and Staphylococcal bacteremia in 08/2016. She was admitted in July 2019 for left middle finger tenosynovitis in the settnig of a cat bite, found to be due to Pasteurella spp and also diagnosed with HCV.  Here today for follow-up of HCV treatment on Glec/prav since 12/27. Here today for ~4 week follow-up.    Interim updates:  -Generally feeling ok  -Intermittent headaches, unilateral, not every day. These resemble her typical migrains but seem more severe in recent weeks. Triptan helps, and headaches are overall manageable.   -Denies fevers, chills, n/v/d, weight gain or loss.   -Reports adherence to Mavyret. Denies lapses in therapy.    Allergies:  Ginger    Medications:   Current Outpatient Medications on File Prior to Visit   Medication Sig Dispense Refill   ??? albuterol HFA 90 mcg/actuation inhaler Inhale 2 puffs every six (6) hours as needed for wheezing. 1 Inhaler 11   ??? ARIPiprazole (ABILIFY) 15 MG tablet Take 1 tablet (15 mg total) by mouth daily. 90 tablet 3   ??? buPROPion (WELLBUTRIN SR) 150 MG 12 hr tablet Take 1 tablet (150 mg total) by mouth Two (2) times a day. 60 tablet 11   ??? gabapentin (NEURONTIN) 300 MG capsule Take two capsules in the morning and three capsules at 12 pm and bedtime. 150 capsule 11   ??? glecaprevir-pibrentasvir (MAVYRET) 100-40 mg tablet Take 3 tablets by mouth daily. Take with food. 84 tablet 1   ??? ibuprofen (ADVIL,MOTRIN) 800 MG tablet Take 1 tablet (800 mg total) by mouth every eight (8) hours as needed  for pain. 60 tablet 0   ??? ibuprofen (MOTRIN) 800 MG tablet Take 1 tablet (800 mg total) by mouth every eight (8) hours as needed for pain. 90 tablet 3   ??? levothyroxine (SYNTHROID) 150 MCG tablet Take 1 tablet (150 mcg total) by mouth daily. 30 tablet 11   ??? methadone (DOLOPHINE) 10 MG tablet Take 150 mg by mouth daily at 0600.  1 tablet 0   ??? SUMAtriptan (IMITREX) 50 MG tablet Take 1 tablet (50 mg total) by mouth once as needed for migraine. 12 tablet 2   ??? furosemide (LASIX) 20 MG tablet Take every other day for 1 week (Patient not taking: Reported on 10/22/2018) 3 tablet 0   ??? melatonin 5 mg cap Take 5 mg by mouth nightly. (Patient not taking: Reported on 10/22/2018) 31 each 2   ??? prazosin (MINIPRESS) 5 MG capsule Continue 5mg  at bedtime. 30 capsule 11     No current facility-administered medications on file prior to visit.      Medical History:  Past Medical History:   Diagnosis Date   ??? Abscess of tendon sheath of left hand 11/25/2015   ??? Asthma    ??? Cavitary lesion of lung 09/24/2016   ??? Heroin use    ??? 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 History of cocaine use 11/09/2015   ??? Klebsiella pneumoniae infection 11/10/2015   ??? Streptococcus infection 11/12/2015       Surgical History:  Past Surgical History:   Procedure Laterality Date   ??? HAND DEBRIDEMENT  06/06/2018   ??? OOPHORECTOMY      right for dermoid tumor   ??? PR ADJ TISS XFER HEAD,FAC,HAND <10 SQCM Left 06/06/2018    Procedure: ADJACENT TRANSF CHIN/NECK/AX/FT; 10 SQ CM/LESS;  Surgeon: Garnett Farm, MD;  Location: [facility];  Service: Ortho Hand   ??? PR DEBRIDEMENT, SKIN, SUB-Q TISSUE,MUSCLE,=<20 SQ CM Left 04/19/2018    Procedure: DEBRIDEMENT; SKIN, SUBCUTANEOUS TISSUE, & MUSCLE HAND;  Surgeon: Nemiah Commander, MD;  Location: MAIN OR [facility];  Service: Ortho Spine   ??? PR RAD [facility] Left 06/06/2018    Procedure: SYNOVECTOMY RADICAL FLEXOR PALM/FINGER SNGL EA;  Surgeon: Jamesetta Geralds Draeger, MD;  Location: [facility] OR [facility];  Service: Ortho Hand   ??? TONSILLECTOMY AND ADENOIDECTOMY         Social History, reviewed and updated today:  Remains abstinent from tobacco  No other drugs currently.  Sporadic alcohol  Lives in [place] with parents.    Family History:  Family History   Problem Relation Age of Onset   ??? No Known Problems Mother    ??? No Known Problems Father    ??? Depression Sister         2 sisters have experienced depression   ??? Suicidality Maternal Grandmother         MGM and 3 great Aunts all died by suicide within a few days of each other Immunizations:  Immunization History   Administered Date(s) Administered   ??? Influenza Vaccine Quad (IIV4 PF) 17mo+ injectable 11/15/2015, 10/06/2016, 06/10/2018   ??? Influenza Virus Vaccine, unspecified formulation 11/15/2015, 06/19/2017   ??? PNEUMOCOCCAL POLYSACCHARIDE 23 05/22/2017   ??? TdaP 06/10/2018       Review of Systems:  ROS otherwise negative except as per HPI.     Objective     Vital Signs:  BP 103/68  - Pulse 76  - Temp 36.8 ??C (98.2 ??F) (Oral)  - Ht  167.6 cm (5' 5.98)  - Wt (!) 111 kg (244 lb 12.8 oz)  - BMI 39.53 kg/m??        Physical Exam:  Constitutional:  No distress. Well-appearing. Well-nourished.  ENT: Anicteric sclera. No oral lesions or thrush.  Resp: Comfortable on room air. CTAB. Good air movement.  CV: RRR. No murmurs.  GI: Soft. Nondistended. Obese. Nontender throughout.  Ext: No obvious joint swelling. No apparentedema on lower legs.  Skin: Warm. No overt rash or lesions.   Neuro:  Fluent speech. Alert, interactive and appropriate. Ambulates without assistance.        Labs:    Lab Results   Component Value Date    WBC 7.2 07/23/2018    HGB 14.6 07/23/2018    HCT 44.6 07/23/2018    PLT 304 07/23/2018       Lab Results   Component Value Date    NA 138 07/23/2018    K 4.6 07/23/2018    CL 101 07/23/2018    CO2 28.0 07/23/2018    BUN 14 07/23/2018    CREATININE 1.20 (H) 07/23/2018    CREATININE 1.20 (H) 07/23/2018    GLU 79 07/23/2018    CALCIUM 9.5 07/23/2018       Lab Results   Component Value Date    BILITOT 0.4 07/23/2018    BILIDIR <0.10 07/23/2018    PROT 8.6 (H) 07/23/2018    ALBUMIN 4.3 07/23/2018    ALT 109 (H) 07/23/2018    AST 76 (H) 07/23/2018    ALKPHOS 130 (H) 07/23/2018         Results for Shannon Washington (MRN [id number]) as of 07/23/2018 09:56   Ref. Range 04/19/2018 18:57 04/23/2018 15:00   Hep B Surface Ag Latest Ref Range: Nonreactive   Nonreactive   Hep B S Ab Latest Ref Range: Nonreactive, [place]  Reactive (A)    Hep B Surf Ab Quant Latest Ref Range: <8.00 m(IU)/mL 72.79 (H)    Hep B Core Total Ab Latest Ref Range: Nonreactive  Nonreactive    Hep B Core IgM Latest Ref Range: Nonreactive  Nonreactive    Hepatitis C Ab Latest Ref Range: Nonreactive  Reactive (A)    HCV RNA (IU) Latest Ref Range: <=0 IU/mL 243,060 (H)    HCV RNA Log(10) Latest Ref Range: <0.00 log IU/mL 5.39 (H)    HCV RNA Comment Unknown Hepatitis C virus...    HCV Genotype Unknown 1a

## 2018-10-24 LAB — HEPATITIS C RNA, QUANTITATIVE, PCR

## 2018-10-24 LAB — HCV RNA(IU): Hepatitis C virus RNA:ACnc:Pt:Ser/Plas:Qn:Probe.amp.tar: 0

## 2018-10-24 NOTE — Unmapped (Signed)
Attending Attestation:  I saw and evaluated the patient, participating in the key portions of the service.     I discussed the findings and plan with the patient.   I determined the assessment and plan of care for the patient.  I reviewed and agree with the documented findings and plan in the resident's note above.  I personally performed all procedures.  --Evalee Jefferson, MD  October 24, 2018 8:33 AM

## 2018-10-29 NOTE — Unmapped (Addendum)
Labs stable and Hep C RNA is undetectable. Therefore, continue Mavyret as planned and return at next schedule appt for SVR-12. Spoke with patient and conveyed results.

## 2018-11-10 DIAGNOSIS — M24542 Contracture, left hand: Principal | ICD-10-CM

## 2018-11-10 MED ORDER — OXYCODONE 5 MG TABLET
ORAL_TABLET | ORAL | 0 refills | 0 days | Status: CP | PRN
Start: 2018-11-10 — End: 2018-11-11
  Filled 2018-11-11: qty 15, 2d supply, fill #0

## 2018-11-10 MED ORDER — GABAPENTIN 100 MG CAPSULE
ORAL_CAPSULE | Freq: Three times a day (TID) | ORAL | 0 refills | 0 days | Status: CP
Start: 2018-11-10 — End: 2019-05-21
  Filled 2018-11-11: qty 90, 30d supply, fill #0

## 2018-11-10 MED ORDER — DOCUSATE SODIUM 100 MG CAPSULE
ORAL_CAPSULE | Freq: Two times a day (BID) | ORAL | 0 refills | 0 days | Status: CP
Start: 2018-11-10 — End: 2018-12-11
  Filled 2018-11-11: qty 60, 30d supply, fill #0

## 2018-11-10 MED ORDER — ONDANSETRON 4 MG DISINTEGRATING TABLET
ORAL_TABLET | Freq: Three times a day (TID) | ORAL | 0 refills | 0.00000 days | Status: CP | PRN
Start: 2018-11-10 — End: 2018-12-10
  Filled 2018-11-11: qty 14, 4d supply, fill #0

## 2018-11-10 MED ORDER — ACETAMINOPHEN 500 MG TABLET
ORAL_TABLET | Freq: Three times a day (TID) | ORAL | 0 refills | 0.00000 days | Status: CP
Start: 2018-11-10 — End: 2018-11-11
  Filled 2018-11-11: qty 42, 7d supply, fill #0

## 2018-11-11 ENCOUNTER — Ambulatory Visit: Admit: 2018-11-11 | Discharge: 2018-11-11 | Payer: MEDICAID

## 2018-11-11 ENCOUNTER — Encounter
Admit: 2018-11-11 | Discharge: 2018-11-11 | Payer: MEDICAID | Attending: Student in an Organized Health Care Education/Training Program | Primary: Student in an Organized Health Care Education/Training Program

## 2018-11-11 DIAGNOSIS — M24542 Contracture, left hand: Principal | ICD-10-CM

## 2018-11-11 MED ORDER — ACETAMINOPHEN 500 MG TABLET
ORAL_TABLET | Freq: Three times a day (TID) | ORAL | 0 refills | 0 days | Status: CP
Start: 2018-11-11 — End: 2019-04-08

## 2018-11-11 MED ORDER — HYDROCODONE 5 MG-ACETAMINOPHEN 325 MG TABLET T-HOME
ORAL_TABLET | Freq: Four times a day (QID) | ORAL | 0 refills | 0.00000 days | Status: CP | PRN
Start: 2018-11-11 — End: 2018-11-16

## 2018-11-11 MED FILL — GABAPENTIN 100 MG CAPSULE: 30 days supply | Qty: 90 | Fill #0 | Status: AC

## 2018-11-11 MED FILL — ACETAMINOPHEN 500 MG TABLET: 7 days supply | Qty: 42 | Fill #0 | Status: AC

## 2018-11-11 MED FILL — HYDROCODONE 5 MG-ACETAMINOPHEN 325 MG TABLET: ORAL | 1 days supply | Qty: 5 | Fill #0

## 2018-11-11 MED FILL — HYDROCODONE 5 MG-ACETAMINOPHEN 325 MG TABLET: 1 days supply | Qty: 5 | Fill #0 | Status: AC

## 2018-11-11 MED FILL — ONDANSETRON 4 MG DISINTEGRATING TABLET: 4 days supply | Qty: 14 | Fill #0 | Status: AC

## 2018-11-11 MED FILL — OXYCODONE 5 MG TABLET: 2 days supply | Qty: 15 | Fill #0 | Status: AC

## 2018-11-11 MED FILL — DOK 100 MG CAPSULE: 30 days supply | Qty: 60 | Fill #0 | Status: AC

## 2018-11-11 NOTE — Unmapped (Signed)
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    Performing Service: Orthopedics  Surgeon(s) and Role:     * Garnett Farm, MD - Primary     * Rochele Raring, MD - Resident - Assisting    Assistant: None    Findings: Same    Anesthesia: General    Estimated Blood Loss: 1 mL    Complications: None    Specimens: None collected    Implants:   Implant Name Type Inv. Item Serial No. Manufacturer Lot No. LRB No. Used   SYSTEM DIGIT WIDGET EXTERNAL FIXATION - [id number] Hardware SYSTEM DIGIT WIDGET EXTERNAL FIXATION  HAND BIOMECHANICAL LAB IN [id number] Left 1       Surgeon Notes: I was present and scrubbed for the entire procedure    Rochele Raring   Date: 11/11/2018  Time: 8:40 AM

## 2018-11-11 NOTE — Unmapped (Addendum)
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  Application of a uniplane, unilateral, external fixation system, left long finger external fixator placement, CPT 20690     Resident Physician(s): Burna Cash, MD.    Anesthesia: General     Antibiotics: Ancef 2 grams IV preoperatively.    Tourniquet time: None used.    Estimated Blood Loss: Minimal     Complications: None     Specimens: None     Indications for Surgery:    Shannon Washington is a 43 y.o. female with history of previous extensor tendon surgery on the left long finger years ago who more recently developed pyogenic flexor tenosynovitis of the left long finger from an unrelated injury.  She underwent serial debridements of her pyogenic flexor tenosynovitis and had difficulty healing her wound which required repeat debridement and adjacent tissue rearrangement.  She went on to heal her wound, but had evidence of substantial contracture at the PIP joint.  After discussing ongoing nonoperative management, versus digit widget placement for stretching of her PIP joint and treatment of her contracture without making an extensive surgical incision, she wished to proceed with digit widget placement and preoperative consent was obtained. She presents to the operating room today for surgical treatment.  The risks, benefits, alternatives, and complications of the procedure were explained to the patient and their family and all their questions were answered.        Operative Findings:  Successful placement of left long finger digit widget with placement using multiplanar fluoroscopic guidance.    Procedure:    The patient was identified in the preoperative holding area where the surgical site was marked with indelible ink. The patient was taken to the OR where a procedural timeout was called and the above noted anesthesia was induced.  Preoperative antibiotics were dosed.  The patient's left upper extremity was prepped and draped in the usual sterile fashion.  Prior to the start of the surgical procedure a second preoperative timeout was called which included verifying the correct patient, the correct operative site, and correct procedure.    We then commenced with our operation.      We began by placing the guide clamp on the dorsal aspect of the left long finger middle phalanx and localized this with multiplanar fluoroscopic imaging.  We then placed the proximal drill wire followed by the distal drill wire which we then removed and replaced with the distal bone pin.  Please note that we placed all of these under direct fluoroscopic guidance and visualization to be just through the volar cortex, but not prominent to the flexor tendons.  We then replaced the proximal guidewire with a proximal pin.  We trimmed the pins and removed the guide clamp.  We then assembled the digit widget frame. We obtained final fluoroscopic images confirming placement of the frame.  We cut the excess guidepin length.  At this point we assembled the outrigger including the hand cuff and placed a single light elastic.  Being satisfied with the placement of the digit widget, anesthesia was reversed and the patient was transferred from the operating table to the stretcher and taken to the PACU in stable condition with no complications apparent.    Post-op Plan/Instructions:  The patient will be discharged home. Marland Kitchen  She will be non weight-bearing on the operative extremity.  No DVT prophylaxis is indicated as the risk of DVT is acceptably low.  Follow up plan will be 1 week..        Teaching Surgeon Attestation: Evalee Jefferson, MD was present, scrubbed and an active participant for the entire procedure.      Shannon Washington   Date: 11/11/2018  Time: 9:33 AM

## 2018-11-20 ENCOUNTER — Ambulatory Visit
Admit: 2018-11-20 | Discharge: 2018-11-21 | Payer: MEDICAID | Attending: Orthopaedic Surgery | Primary: Orthopaedic Surgery

## 2018-11-20 DIAGNOSIS — M659 Synovitis and tenosynovitis, unspecified: Principal | ICD-10-CM

## 2018-11-20 DIAGNOSIS — M24541 Contracture, right hand: Secondary | ICD-10-CM

## 2018-11-20 NOTE — Unmapped (Signed)
ORTHOPAEDIC RETURN CLINIC NOTE     ASSESSMENT:  Shannon Washington is a 43 y.o. 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  Patient has been tolerating light band well.  Patient is currently resting at 55 degrees of flexion at PIP joint.  Patient tolerated medium band in clinic today.  Patient will discharge with medium band however counseled that she may progress to both medium and light band and/or hard band prior to follow-up evaluation.  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  Patient denies any significant postoperative pain that is been well controlled on current regimen    OBJECTIVE:  Physical Examination:    General  Well nourished, appearing stated age   Not in acute distress   Alert and oriented x3  Appropriate affect and mood  Appropriate to conversation   No increased work of breathing on room air    Musculoskeletal  Left Upper Extremity:  Skin  -appears pink and well perfused with no evidence of trauma.   surgical incision healing well without evidence of infection.  Pins in place appear well fixed  ROM  -active ROM, wrist, elbow is painless.  Digit widget in place over left long finger, left long finger PIP at 55 degrees of flexion  Inspection/Palpation  - No tenderness to palpation about the elbow, wrist, or hand  Motor/Strength  -Wrist extension, wrist flexion, IO, grip strength 5/5  Sensory  -sensation to light touch intact in median, radial and ulnar nerve distributions  Stability  - No gross instability noted at the elbow, wrist, or fingers  Vascular  -fingers are warm, with brisk capillary refill  2+ radial pulse      IMAGING: No new imaging today

## 2018-11-20 NOTE — Unmapped (Signed)
Dr Waylan Boga has ordered hand therapy for you.  We prefer for you to work with hand therapy here with [facility] to allow [place] to collaborate closely.  To set up your appointment please call [facility] Therapy Department at [phone].      If you do not live close by you are welcome to work with an outside hand therapist.  A great reference to find a certified hand therapist in your area is [email or web address]     If your hand therapist requests any additional information they can contact Dr Draeger's  CMA Archie Patten [email or web address] phone [phone], fax [phone].)

## 2018-11-24 NOTE — Unmapped (Signed)
Attending Attestation:  I saw and evaluated the patient, participating in the key portions of the service.     I discussed the findings and plan with the patient.   I determined the assessment and plan of care for the patient.  I reviewed and agree with the documented findings and plan in the resident's note above.  I personally performed all procedures.  --Evalee Jefferson, MD  November 23, 2018 8:40 PM

## 2018-11-27 ENCOUNTER — Ambulatory Visit
Admit: 2018-11-27 | Discharge: 2018-11-28 | Payer: MEDICAID | Attending: Orthopaedic Surgery | Primary: Orthopaedic Surgery

## 2018-11-27 ENCOUNTER — Ambulatory Visit
Admit: 2018-11-27 | Discharge: 2018-12-14 | Payer: MEDICAID | Attending: Rehabilitative and Restorative Service Providers" | Primary: Rehabilitative and Restorative Service Providers"

## 2018-11-27 DIAGNOSIS — M24541 Contracture, right hand: Principal | ICD-10-CM

## 2018-11-27 DIAGNOSIS — M65042 Abscess of tendon sheath, left hand: Principal | ICD-10-CM

## 2018-11-27 DIAGNOSIS — F1511 Other stimulant abuse, in remission: Principal | ICD-10-CM

## 2018-11-27 DIAGNOSIS — S61452S Open bite of left hand, sequela: Principal | ICD-10-CM

## 2018-11-27 DIAGNOSIS — A491 Streptococcal infection, unspecified site: Principal | ICD-10-CM

## 2018-11-27 DIAGNOSIS — M659 Synovitis and tenosynovitis, unspecified: Principal | ICD-10-CM

## 2018-11-27 DIAGNOSIS — M25642 Stiffness of left hand, not elsewhere classified: Principal | ICD-10-CM

## 2018-11-27 DIAGNOSIS — Z87898 Personal history of other specified conditions: Principal | ICD-10-CM

## 2018-11-27 DIAGNOSIS — J45909 Unspecified asthma, uncomplicated: Principal | ICD-10-CM

## 2018-11-27 DIAGNOSIS — B961 Klebsiella pneumoniae [K. pneumoniae] as the cause of diseases classified elsewhere: Principal | ICD-10-CM

## 2018-11-27 DIAGNOSIS — W5501XS Bitten by cat, sequela: Principal | ICD-10-CM

## 2018-11-27 DIAGNOSIS — F1190 Opioid use, unspecified, uncomplicated: Principal | ICD-10-CM

## 2018-11-27 DIAGNOSIS — J984 Other disorders of lung: Principal | ICD-10-CM

## 2018-11-27 NOTE — Unmapped (Signed)
ORTHOPAEDIC RETURN CLINIC NOTE     ASSESSMENT:  Shannon Washington is a 43 y.o. 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 Patient has been tolerating medium band well. Flexion progressively improving, resting at 47 degrees today. She is pleased with results thus far. She is scheduled to begin working with hand therapy today for flexion of finger and instruction on how to [employer] and [employer], manufacturing systems. We reviewed how to doff and don her digit wigit by removing the outer rigor for hygiene and to work on flexion with therapy. Continue to encourage increase strengthening of rubber bands to increase correction of PIP contraction. She will return to clinic in 4 weeks.     INTERIM HISTORY:  Patient returns to clinic for 2nd post-operative visit following the above mentioned procedures. She continues to have some residual soreness, but overall pain is well managed. Tolerating digit widget placement. Denies fever chills. No redness, warmth or drainage from incision site.     PHYSICAL EXAM:  General: Well developed, well nourished female in no apparent distress  Musculoskeletal:   Left Upper Extremity   Skin  -appears pink and well perfused with no evidence of trauma.   surgical incision healing well without evidence of infection.  Pins in place appear well fixed  ROM  -active ROM, wrist, elbow is painless.  Digit widget in place over left long finger, left long finger PIP at 47 degrees of flexion  Inspection/Palpation  - No tenderness to palpation about the elbow, wrist, or hand  Motor/Strength  -Wrist extension, wrist flexion, IO, grip strength 5/5  Sensory  -sensation to light touch intact in median, radial and ulnar nerve distributions  Stability  - No gross instability noted at the elbow, wrist, or fingers  Vascular  -fingers are warm, with brisk capillary refill  2+ radial pulse  ??    IMAGING:  None today. Scribe's Attestation: Corinna Gab, MD obtained and performed the history, physical exam and medical decision making elements that were entered into the chart.  Signed by Jerl Santos, Scribe, on November 27, 2018 at 1:10 PM.    ----------------------------------------------------------------------------------------------------------------------  November 27, 2018 3:37 PM. Documentation assistance provided by the Scribe. I was present during the time the encounter was recorded. The information recorded by the Scribe was done at my direction and has been reviewed and validated by me.  ----------------------------------------------------------------------------------------------------------------------

## 2018-11-27 NOTE — Unmapped (Signed)
OUTPATIENT OCCUPATIONAL THERAPY    UPPER EXTREMITY EVALUATION    Patient Name: Shannon Washington  Date of Birth:August 15, 1976  Date: 11/27/2018  Visit #: 1  Encounter Diagnoses   Name Primary?   ??? Stiffness of finger joint of left hand Yes   ??? Cat bite of left hand, sequela      Reason for referral : LEFT   Referring Provider: Jamesetta Geralds Draeger  Onset of Symptoms: 11/11/2018 placement of digit widget to LEFT MF  Per Referring Provider's note: s/p left long finger ex fix application on 2/18; ROM and begin working on finger extension at follow up appt in one week    Communication preference: verbal, written, visual  Prognosis: good    OT ASSESSMENT:   43 y.o. year old female with above diagnosis. She underwent application of digit widget to her left MF just over 3 weeks ago.  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  Patient requires skilled Occupational Therapy services for decreased range of motion, decreased strength, and impaired daily activities of living as appropriate.     Previous Level of Function: Pt was previously independent with all ADLs and IADLs.     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             Quick DASH:   1. Open a tight or new jar: 5  2. Do heavy household chores (e.g. Wash walls, floors): 3  3. Carry a shopping bag or briefcase: 1  4. Wash your back: 5  5. Use a knife to cut food: 5  6. Recreational Activities in which you take some force or impact through your arm, shoulder or hand (e.g. Golf, hammering, tennis, etc.): 5  7. During the past week to what extent has your arm, shoulder or hand problem interfered with your normal social activities with family, friends, neighbors or groups? 4  8. During the past week, were you limited in your work or other regular daily activities as a result of your arm, shoulder or hand problem?: 5  Please rate the severity of the following symptoms in the past week:  9. Arm, shoulder or hand pain: 2  10. Tingling (pins and needles) in your arm, shoulder and hand: 3  11. During the past week, how much difficulty have you had sleeping because of the pain in your arm, shoulder or hand? 2    Score 40 = 65.9% impairment:  Scaling is ranked from 0 indicating least disability to 100 indicating most disability. Normative data for the DASH Outcome Measure have been collected in a large general population. Hunsaker (2002) reported that the general population would score 10.1 on the DASH with a standard deviation of 14.68.    Low complexity: This patient demonstrates 1-3 performance deficits relating to physical, cognitive, and psychosocial skills (see Quick DASH assessment) resulting in activity limitations and/or participation restrictions.  This patient has no co-morbidities affecting occupational performance.  A problem focused assessment was performed.  Please refer to Current level of function section for further details.     Short Term Goals:  1. In 1 session, patient will perform home exercise program with need for cuing to max IND with ADLs and IADLs. (met)  2. In 1 session, patient will demonstrate independence with application and care of digit widget.  (met)      Long Term  Goals:   1. In 12 weeks, patient will perform upgraded home exercise program, to include progression to strengthening, independently  to max IND with ADLs and IADLs.  2. In 12 weeks, pt will demo full composite flexion and extension of left MF to max IND with grasp and release of daily living tools.   3. Pt will have 40# grip in left hand.     OT  PLAN OF CARE:  Pt will participate in:  Self Care/Hometraining  Splinting  Therapeutic Exercise  Therapeutic Activity   Neuromuscular Re-education  Ultrasound  Hot/Cold Pack  Electrical Stimulation  Iontophoresis  Orthotic/Prosthetic Measure and Fit   Joint Mobilization  Physical Performance Measure     Planned frequency and duration of treatment: every other week/ 12 weeks. Plan will be adjusted as necessary.     Patient in agreement with plan of care?: Yes    SUBJECTIVE:  Patient goals: I want to straighten my finger out as much as possible    PAST MEDICAL HISTORY:  Reviewed   Past Medical History:   Diagnosis Date   ??? Abscess of tendon sheath of left hand 11/25/2015   ??? Asthma    ??? Cavitary lesion of lung 09/24/2016   ??? Heroin use    ??? 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 History of cocaine use 11/09/2015   ??? Klebsiella pneumoniae infection 11/10/2015   ??? Streptococcus infection 11/12/2015       Past Surgical History: Reviewed  Past Surgical History:   Procedure Laterality Date   ??? HAND DEBRIDEMENT  06/06/2018   ??? OOPHORECTOMY      right for dermoid tumor   ??? PR ADJ TISS XFER HEAD,FAC,HAND <10 SQCM Left 06/06/2018    Procedure: ADJACENT TRANSF CHIN/NECK/AX/FT; 10 SQ CM/LESS;  Surgeon: Garnett Farm, MD;  Location: ASC OR [facility];  Service: Ortho Hand   ??? PR APPLY BONE UNIPLANE,EXT FIX DEV Left 11/11/2018    Procedure: APPLICATION OF A UNIPLANE (PINS OR WIRES IN ONE PLANE) UNILATERAL, EXTERNAL FIXATION SYSTEM--LEFT LONG FINGER;  Surgeon: Garnett Farm, MD;  Location: ASC OR [facility];  Service: Orthopedics   ??? PR DEBRIDEMENT, SKIN, SUB-Q TISSUE,MUSCLE,=<20 SQ CM Left 04/19/2018    Procedure: DEBRIDEMENT; SKIN, SUBCUTANEOUS TISSUE, & MUSCLE HAND;  Surgeon: Nemiah Commander, MD;  Location: MAIN OR [facility];  Service: Ortho Spine   ??? PR RAD [facility] Left 06/06/2018    Procedure: SYNOVECTOMY RADICAL FLEXOR PALM/FINGER SNGL EA;  Surgeon: Jamesetta Geralds Draeger, MD;  Location: ASC [facility];  Service: Ortho Hand   ??? TONSILLECTOMY AND ADENOIDECTOMY         Allergies: Reviewed  Ginger    Medications: Reviewed    Current Outpatient Medications:   ???  acetaminophen (TYLENOL) 500 MG tablet, Take 1 tablet (500 mg total) by mouth every eight (8) hours., Disp: 42 tablet, Rfl: 0  ???  albuterol HFA 90 mcg/actuation inhaler, Inhale 2 puffs every six (6) hours as needed for wheezing., Disp: 1 Inhaler, Rfl: 11  ???  ARIPiprazole (ABILIFY) 15 MG tablet, Take 1 tablet (15 mg total) by mouth daily., Disp: 90 tablet, Rfl: 3  ???  buPROPion (WELLBUTRIN SR) 150 MG 12 hr tablet, Take 1 tablet (150 mg total) by mouth Two (2) times a day., Disp: 60 tablet, Rfl: 11  ???  docusate sodium (COLACE) 100 MG capsule, Take 1 capsule (100 mg total) by mouth Two (2) times a day., Disp: 60 capsule, Rfl: 0  ???  furosemide (LASIX) 20 MG tablet, Take every other day for 1 week (Patient not taking: Reported on 10/22/2018), Disp: 3 tablet, Rfl: 0  ???  gabapentin (NEURONTIN) 100 MG capsule, Take 1 capsule (100 mg total) by mouth Three (3) times a day., Disp: 90 capsule, Rfl: 0  ???  gabapentin (NEURONTIN) 300 MG capsule, Take two capsules in the morning and three capsules at 12 pm and bedtime., Disp: 150 capsule, Rfl: 11  ???  glecaprevir-pibrentasvir (MAVYRET) 100-40 mg tablet, Take 3 tablets by mouth daily. Take with food., Disp: 84 tablet, Rfl: 1  ???  ibuprofen (ADVIL,MOTRIN) 800 MG tablet, Take 1 tablet (800 mg total) by mouth every eight (8) hours as needed for pain., Disp: 60 tablet, Rfl: 0  ???  ibuprofen (MOTRIN) 800 MG tablet, Take 1 tablet (800 mg total) by mouth every eight (8) hours as needed for pain., Disp: 90 tablet, Rfl: 3  ???  levothyroxine (SYNTHROID) 150 MCG tablet, Take 1 tablet (150 mcg total) by mouth daily., Disp: 30 tablet, Rfl: 11  ???  melatonin 5 mg cap, Take 5 mg by mouth nightly. (Patient not taking: Reported on 10/22/2018), Disp: 31 each, Rfl: 2  ???  methadone (DOLOPHINE) 10 MG tablet, Take 120 mg by mouth daily at 0600. , Disp: 1 tablet, Rfl: 0  ???  ondansetron (ZOFRAN ODT) 4 MG disintegrating tablet, Dissolve 1 tablet (4 mg total) on the tongue every eight (8) hours as needed for nausea., Disp: 14 tablet, Rfl: 0  ???  prazosin (MINIPRESS) 5 MG capsule, Continue 5mg  at bedtime., Disp: 30 capsule, Rfl: 11  ???  SUMAtriptan (IMITREX) 50 MG tablet, Take 1 tablet (50 mg total) by mouth once as needed for migraine., Disp: 12 tablet, Rfl: 2    Precautions: no strengthening    Prior OT Service: no    Pain: 0/10 at best, 3/10 at worst    OBJECTIVE    Sensation: intact per pt report    Upper Extremity Function:    Shoulder:  WNL    Elbow:  WNL    Hand:   Pt is right hand dominant               AROM (degrees) Date: 11/27/2018  LEFT Date:   Left   Wrist   extension/flexion 65/45    Supination/pronation WNL    rad/uln deviation WNL    Composite flex to DPC   (cm lack)     Index 2    Middle 2    Ring 1    Small 1    Digit Extension MF PIP 50/80    Thumb  Opposition  WFL      Gross grip strength (pounds)  Position 2 NT    Pinch strength (pounds)  Lateral  palmar NT      Wound/Incision(s) or Scar: healed, no  drainage    Edema: mild   Educated pt on the need to perform active range of motion to allow adequate venous return and prevent stiffness.     Therapeutic Exercise(15 mins):  Active Composite flexion, extension of left hand.   NO PROM at this time  AROM of wrist  Instruction in HEP of the same    Self care (15 mins)  Apply and remove some components of digit widget per MD for hygiene.    Pt demonstrated independence and understanding    I reviewed the no-show/attendance policy with the patient and caregiver(s). The family is aware that they must call to cancel appointments  more than 24 hours in advance. They are also aware that if they late cancel or no-show three times, we reserve the right to cancel their remaining appointments. This policy is in place to allow [place] to best serve the needs of our caseload.    Treatment Rendered:   Theraputic Exercise: 15 min   Self Care/Home Training: 15 min    Total Evaluation Time: 15 mins    Patient Education:  Topics: home program, disease process  Education Provided to: patient  Education Type: education, demonstration, literature  Response to education/teachback: verbal understanding received, return demonstration    I attest that I have reviewed the above information.  Signed: Everrett Coombe, OT  11/27/2018 2:13 PM

## 2018-12-22 NOTE — Unmapped (Signed)
Disenrolled from [employer].  Re: Hep C treatment with [facility] is completed.    Corliss Skains. [place], [place].[facility]  [phone] option 4

## 2018-12-25 ENCOUNTER — Ambulatory Visit
Admit: 2018-12-25 | Discharge: 2018-12-26 | Payer: MEDICAID | Attending: Orthopaedic Surgery | Primary: Orthopaedic Surgery

## 2018-12-30 ENCOUNTER — Ambulatory Visit: Admit: 2018-12-30 | Discharge: 2018-12-31 | Payer: MEDICAID

## 2018-12-30 ENCOUNTER — Ambulatory Visit
Admit: 2018-12-30 | Discharge: 2018-12-31 | Payer: MEDICAID | Attending: Orthopaedic Surgery | Primary: Orthopaedic Surgery

## 2018-12-30 DIAGNOSIS — M24541 Contracture, right hand: Principal | ICD-10-CM

## 2019-01-29 ENCOUNTER — Ambulatory Visit: Admit: 2019-01-29 | Discharge: 2019-01-29 | Payer: MEDICAID

## 2019-01-29 ENCOUNTER — Ambulatory Visit
Admit: 2019-01-29 | Discharge: 2019-01-29 | Payer: MEDICAID | Attending: Orthopaedic Surgery | Primary: Orthopaedic Surgery

## 2019-01-29 DIAGNOSIS — M24542 Contracture, left hand: Principal | ICD-10-CM

## 2019-02-06 ENCOUNTER — Ambulatory Visit
Admit: 2019-02-06 | Discharge: 2019-02-12 | Payer: MEDICAID | Attending: Rehabilitative and Restorative Service Providers" | Primary: Rehabilitative and Restorative Service Providers"

## 2019-02-06 DIAGNOSIS — M65142 Other infective (teno)synovitis, left hand: Principal | ICD-10-CM

## 2019-02-06 DIAGNOSIS — M25642 Stiffness of left hand, not elsewhere classified: Secondary | ICD-10-CM

## 2019-02-17 ENCOUNTER — Ambulatory Visit: Admit: 2019-02-17 | Discharge: 2019-02-18 | Payer: MEDICAID

## 2019-02-17 ENCOUNTER — Ambulatory Visit
Admit: 2019-02-17 | Discharge: 2019-02-18 | Payer: MEDICAID | Attending: Orthopaedic Surgery | Primary: Orthopaedic Surgery

## 2019-02-17 DIAGNOSIS — M24542 Contracture, left hand: Principal | ICD-10-CM

## 2019-02-20 ENCOUNTER — Ambulatory Visit
Admit: 2019-02-20 | Discharge: 2019-03-14 | Payer: MEDICAID | Attending: Rehabilitative and Restorative Service Providers" | Primary: Rehabilitative and Restorative Service Providers"

## 2019-02-20 DIAGNOSIS — M24542 Contracture, left hand: Principal | ICD-10-CM

## 2019-02-24 ENCOUNTER — Telehealth
Admit: 2019-02-24 | Discharge: 2019-02-25 | Payer: MEDICAID | Attending: Student in an Organized Health Care Education/Training Program | Primary: Student in an Organized Health Care Education/Training Program

## 2019-02-24 DIAGNOSIS — B182 Chronic viral hepatitis C: Principal | ICD-10-CM

## 2019-02-27 ENCOUNTER — Ambulatory Visit
Admit: 2019-02-27 | Discharge: 2019-02-28 | Payer: MEDICAID | Attending: Orthopaedic Surgery | Primary: Orthopaedic Surgery

## 2019-02-27 ENCOUNTER — Ambulatory Visit: Admit: 2019-02-27 | Discharge: 2019-02-28 | Payer: MEDICAID

## 2019-02-27 DIAGNOSIS — S61452S Open bite of left hand, sequela: Secondary | ICD-10-CM

## 2019-02-27 DIAGNOSIS — W5501XS Bitten by cat, sequela: Secondary | ICD-10-CM

## 2019-02-27 DIAGNOSIS — M24542 Contracture, left hand: Principal | ICD-10-CM

## 2019-02-27 DIAGNOSIS — B182 Chronic viral hepatitis C: Principal | ICD-10-CM

## 2019-03-23 ENCOUNTER — Ambulatory Visit
Admit: 2019-03-23 | Discharge: 2019-03-24 | Payer: MEDICAID | Attending: Orthopaedic Surgery | Primary: Orthopaedic Surgery

## 2019-03-23 DIAGNOSIS — M24542 Contracture, left hand: Principal | ICD-10-CM

## 2019-04-06 ENCOUNTER — Ambulatory Visit: Admit: 2019-04-06 | Discharge: 2019-04-07 | Payer: MEDICAID | Attending: Family | Primary: Family

## 2019-04-06 DIAGNOSIS — Z1159 Encounter for screening for other viral diseases: Principal | ICD-10-CM

## 2019-04-07 DIAGNOSIS — M24542 Contracture, left hand: Principal | ICD-10-CM

## 2019-04-08 ENCOUNTER — Encounter: Admit: 2019-04-08 | Discharge: 2019-04-08 | Payer: MEDICAID | Attending: Anesthesiology | Primary: Anesthesiology

## 2019-04-08 ENCOUNTER — Ambulatory Visit: Admit: 2019-04-08 | Discharge: 2019-04-08 | Payer: MEDICAID

## 2019-04-08 DIAGNOSIS — M24542 Contracture, left hand: Principal | ICD-10-CM

## 2019-04-08 MED ORDER — ONDANSETRON 4 MG DISINTEGRATING TABLET
ORAL_TABLET | Freq: Three times a day (TID) | ORAL | 0 refills | 5 days | Status: CP | PRN
Start: 2019-04-08 — End: 2019-04-08

## 2019-04-08 MED ORDER — PROMETHAZINE 12.5 MG TABLET: 6 mg | tablet | Freq: Three times a day (TID) | 0 refills | 5 days | Status: AC

## 2019-04-08 MED ORDER — GABAPENTIN 100 MG CAPSULE
ORAL_CAPSULE | Freq: Three times a day (TID) | ORAL | 0 refills | 20.00000 days | Status: CP
Start: 2019-04-08 — End: 2019-05-21

## 2019-04-08 MED ORDER — ACETAMINOPHEN 325 MG TABLET
ORAL_TABLET | Freq: Three times a day (TID) | ORAL | 0 refills | 4.00000 days | Status: CP | PRN
Start: 2019-04-08 — End: 2019-04-08

## 2019-04-08 MED ORDER — OXYCODONE 5 MG TABLET
ORAL_TABLET | ORAL | 0 refills | 2 days | Status: CP | PRN
Start: 2019-04-08 — End: 2019-05-21
  Filled 2019-04-08: qty 10, 2d supply, fill #0

## 2019-04-08 MED ORDER — PROMETHAZINE 12.5 MG TABLET
ORAL_TABLET | Freq: Three times a day (TID) | ORAL | 0 refills | 3.00000 days | Status: CP | PRN
Start: 2019-04-08 — End: 2019-04-08
  Filled 2019-04-08: qty 7, 5d supply, fill #0

## 2019-04-08 MED ORDER — ACETAMINOPHEN 500 MG TABLET
ORAL_TABLET | Freq: Three times a day (TID) | ORAL | 0 refills | 7 days | Status: CP
Start: 2019-04-08 — End: ?
  Filled 2019-04-08: qty 42, 7d supply, fill #0

## 2019-04-08 MED ORDER — DOCUSATE SODIUM 100 MG CAPSULE
ORAL_CAPSULE | Freq: Two times a day (BID) | ORAL | 0 refills | 30.00000 days | Status: CP
Start: 2019-04-08 — End: 2019-05-08
  Filled 2019-04-08: qty 60, 30d supply, fill #0

## 2019-04-08 MED FILL — OXYCODONE 5 MG TABLET: 2 days supply | Qty: 10 | Fill #0 | Status: AC

## 2019-04-08 MED FILL — DOK 100 MG CAPSULE: 30 days supply | Qty: 60 | Fill #0 | Status: AC

## 2019-04-08 MED FILL — ACETAMINOPHEN 500 MG TABLET: 7 days supply | Qty: 42 | Fill #0 | Status: AC

## 2019-04-08 MED FILL — PROMETHAZINE 12.5 MG TABLET: 5 days supply | Qty: 7 | Fill #0 | Status: AC

## 2019-04-20 ENCOUNTER — Ambulatory Visit: Admit: 2019-04-20 | Discharge: 2019-04-21 | Payer: MEDICAID | Attending: "Endocrinology | Primary: "Endocrinology

## 2019-04-20 DIAGNOSIS — E063 Autoimmune thyroiditis: Secondary | ICD-10-CM

## 2019-04-20 DIAGNOSIS — E038 Other specified hypothyroidism: Secondary | ICD-10-CM

## 2019-04-21 MED ORDER — LEVOTHYROXINE 175 MCG TABLET
ORAL_TABLET | Freq: Every day | ORAL | 3 refills | 30.00000 days | Status: CP
Start: 2019-04-21 — End: ?

## 2019-04-23 ENCOUNTER — Ambulatory Visit
Admit: 2019-04-23 | Discharge: 2019-04-24 | Payer: MEDICAID | Attending: Orthopaedic Surgery | Primary: Orthopaedic Surgery

## 2019-04-23 ENCOUNTER — Ambulatory Visit
Admit: 2019-04-23 | Discharge: 2019-05-13 | Payer: MEDICAID | Attending: Rehabilitative and Restorative Service Providers" | Primary: Rehabilitative and Restorative Service Providers"

## 2019-04-23 ENCOUNTER — Ambulatory Visit: Admit: 2019-04-23 | Discharge: 2019-04-24 | Payer: MEDICAID

## 2019-04-23 DIAGNOSIS — M24542 Contracture, left hand: Principal | ICD-10-CM

## 2019-04-23 DIAGNOSIS — M25642 Stiffness of left hand, not elsewhere classified: Secondary | ICD-10-CM

## 2019-05-21 ENCOUNTER — Ambulatory Visit
Admit: 2019-05-21 | Discharge: 2019-05-22 | Payer: MEDICAID | Attending: Orthopaedic Surgery | Primary: Orthopaedic Surgery

## 2019-05-21 ENCOUNTER — Ambulatory Visit: Admit: 2019-05-21 | Discharge: 2019-05-22 | Payer: MEDICAID

## 2019-05-21 DIAGNOSIS — M24542 Contracture, left hand: Principal | ICD-10-CM

## 2019-06-19 ENCOUNTER — Ambulatory Visit: Admit: 2019-06-19 | Discharge: 2019-06-20 | Payer: MEDICAID | Attending: Community Health | Primary: Community Health

## 2019-06-19 DIAGNOSIS — F419 Anxiety disorder, unspecified: Secondary | ICD-10-CM

## 2019-06-19 DIAGNOSIS — M79606 Pain in leg, unspecified: Secondary | ICD-10-CM

## 2019-06-19 DIAGNOSIS — G2401 Drug induced subacute dyskinesia: Secondary | ICD-10-CM

## 2019-06-19 MED ORDER — CLONAZEPAM 1 MG TABLET
ORAL_TABLET | Freq: Two times a day (BID) | ORAL | 0 refills | 30.00000 days | Status: CP | PRN
Start: 2019-06-19 — End: 2020-06-18

## 2019-06-19 MED ORDER — ARIPIPRAZOLE 5 MG TABLET
ORAL_TABLET | 0 refills | 0 days | Status: CP
Start: 2019-06-19 — End: ?

## 2019-06-19 MED ORDER — GABAPENTIN 300 MG CAPSULE
ORAL_CAPSULE | 11 refills | 0 days | Status: CP
Start: 2019-06-19 — End: ?

## 2019-07-09 ENCOUNTER — Ambulatory Visit: Admit: 2019-07-09 | Discharge: 2019-07-10 | Payer: MEDICAID

## 2019-07-09 ENCOUNTER — Ambulatory Visit
Admit: 2019-07-09 | Discharge: 2019-07-10 | Payer: MEDICAID | Attending: Orthopaedic Surgery | Primary: Orthopaedic Surgery

## 2019-07-10 ENCOUNTER — Ambulatory Visit: Admit: 2019-07-10 | Discharge: 2019-07-11 | Payer: MEDICAID | Attending: Community Health | Primary: Community Health

## 2019-07-10 DIAGNOSIS — F321 Major depressive disorder, single episode, moderate: Principal | ICD-10-CM

## 2019-07-10 DIAGNOSIS — G2401 Drug induced subacute dyskinesia: Principal | ICD-10-CM

## 2019-07-10 DIAGNOSIS — F419 Anxiety disorder, unspecified: Principal | ICD-10-CM

## 2019-07-10 MED ORDER — CLONAZEPAM 1 MG TABLET: tablet | Freq: Three times a day (TID) | 0 refills | 30 days | Status: AC

## 2019-07-10 MED ORDER — BUPROPION HCL SR 150 MG TABLET,12 HR SUSTAINED-RELEASE: 150 mg | tablet | Freq: Two times a day (BID) | 3 refills | 90 days | Status: AC

## 2019-07-28 DIAGNOSIS — E038 Other specified hypothyroidism: Principal | ICD-10-CM

## 2019-07-28 MED ORDER — LEVOTHYROXINE 175 MCG TABLET
ORAL_TABLET | Freq: Every day | ORAL | 3 refills | 30 days | Status: CP
Start: 2019-07-28 — End: ?

## 2019-07-31 ENCOUNTER — Ambulatory Visit: Admit: 2019-07-31 | Discharge: 2019-08-01 | Payer: MEDICAID | Attending: "Endocrinology | Primary: "Endocrinology

## 2019-07-31 DIAGNOSIS — E063 Autoimmune thyroiditis: Principal | ICD-10-CM

## 2019-07-31 DIAGNOSIS — E038 Other specified hypothyroidism: Principal | ICD-10-CM

## 2019-08-01 MED ORDER — LEVOTHYROXINE 200 MCG TABLET
ORAL_TABLET | Freq: Every day | ORAL | 4 refills | 30.00000 days | Status: CP
Start: 2019-08-01 — End: ?

## 2019-08-12 ENCOUNTER — Institutional Professional Consult (permissible substitution): Admit: 2019-08-12 | Discharge: 2019-08-13 | Payer: MEDICAID | Attending: Community Health | Primary: Community Health

## 2019-08-12 MED ORDER — CLONAZEPAM 1 MG TABLET
ORAL_TABLET | Freq: Three times a day (TID) | ORAL | 0 refills | 30 days | Status: CP
Start: 2019-08-12 — End: 2020-08-11

## 2019-09-09 ENCOUNTER — Ambulatory Visit: Admit: 2019-09-09 | Discharge: 2019-09-10 | Payer: MEDICAID | Attending: Community Health | Primary: Community Health

## 2019-09-09 MED ORDER — CLONAZEPAM 1 MG TABLET
ORAL_TABLET | Freq: Three times a day (TID) | ORAL | 0 refills | 30.00000 days | Status: CP
Start: 2019-09-09 — End: 2020-09-08

## 2019-09-09 MED ORDER — IBUPROFEN 800 MG TABLET
ORAL_TABLET | Freq: Three times a day (TID) | ORAL | 2 refills | 20 days | Status: CP | PRN
Start: 2019-09-09 — End: 2020-09-08

## 2019-09-09 MED ORDER — MAGIC MOUTHWASH (NYSTATIN/DIPHENHYDRAMINE/MYLANTA) WITH LIDOCAINE ORAL MIXTURE
Freq: Four times a day (QID) | ORAL | 0 refills | 30 days | Status: CP | PRN
Start: 2019-09-09 — End: 2019-10-09

## 2019-09-29 ENCOUNTER — Ambulatory Visit: Admit: 2019-09-29 | Discharge: 2019-09-30 | Payer: MEDICAID | Attending: Community Health | Primary: Community Health

## 2019-09-29 DIAGNOSIS — F321 Major depressive disorder, single episode, moderate: Principal | ICD-10-CM

## 2019-09-29 DIAGNOSIS — F419 Anxiety disorder, unspecified: Principal | ICD-10-CM

## 2019-09-29 DIAGNOSIS — F331 Major depressive disorder, recurrent, moderate: Principal | ICD-10-CM

## 2019-09-29 MED ORDER — VENLAFAXINE ER 37.5 MG CAPSULE,EXTENDED RELEASE 24 HR
ORAL_CAPSULE | Freq: Every day | ORAL | 2 refills | 30 days | Status: CP
Start: 2019-09-29 — End: 2020-09-28

## 2019-09-29 MED ORDER — CLONAZEPAM 1 MG TABLET
ORAL_TABLET | Freq: Three times a day (TID) | ORAL | 0 refills | 30.00000 days | Status: CP
Start: 2019-09-29 — End: 2020-09-28

## 2019-10-01 DIAGNOSIS — E038 Other specified hypothyroidism: Principal | ICD-10-CM

## 2019-10-01 MED ORDER — LEVOTHYROXINE 200 MCG TABLET
ORAL_TABLET | Freq: Every day | ORAL | 4 refills | 30 days | Status: CP
Start: 2019-10-01 — End: ?

## 2019-10-19 ENCOUNTER — Ambulatory Visit: Admit: 2019-10-19 | Discharge: 2019-10-20 | Payer: MEDICAID | Attending: "Endocrinology | Primary: "Endocrinology

## 2019-10-19 DIAGNOSIS — E063 Autoimmune thyroiditis: Secondary | ICD-10-CM

## 2019-10-19 DIAGNOSIS — E038 Other specified hypothyroidism: Principal | ICD-10-CM

## 2019-10-27 ENCOUNTER — Ambulatory Visit: Admit: 2019-10-27 | Discharge: 2019-10-27 | Payer: MEDICAID | Attending: Community Health | Primary: Community Health

## 2019-10-27 ENCOUNTER — Ambulatory Visit: Admit: 2019-10-27 | Discharge: 2019-10-27 | Payer: MEDICAID

## 2019-10-27 MED ORDER — DICLOFENAC SODIUM 50 MG TABLET,DELAYED RELEASE
ORAL_TABLET | Freq: Two times a day (BID) | ORAL | 0 refills | 30 days | Status: CP
Start: 2019-10-27 — End: 2019-11-26

## 2019-10-27 MED ORDER — DIAZEPAM 5 MG TABLET
ORAL_TABLET | 0 refills | 0 days | Status: CP
Start: 2019-10-27 — End: ?

## 2019-10-27 MED ORDER — CLONAZEPAM 1 MG TABLET
ORAL_TABLET | Freq: Three times a day (TID) | ORAL | 0 refills | 30.00000 days | Status: CP
Start: 2019-10-27 — End: 2020-10-26

## 2019-10-27 MED ORDER — GABAPENTIN 300 MG CAPSULE
ORAL_CAPSULE | 11 refills | 0 days | Status: CP
Start: 2019-10-27 — End: ?

## 2019-10-27 MED ORDER — VENLAFAXINE ER 37.5 MG CAPSULE,EXTENDED RELEASE 24 HR
ORAL_CAPSULE | Freq: Every day | ORAL | 2 refills | 30.00000 days | Status: CP
Start: 2019-10-27 — End: 2020-10-26

## 2019-11-04 ENCOUNTER — Ambulatory Visit: Admit: 2019-11-04 | Discharge: 2019-11-05 | Payer: MEDICAID | Attending: Family Medicine | Primary: Family Medicine

## 2019-11-04 MED ORDER — PREDNISONE 10 MG TABLET
ORAL_TABLET | Freq: Every day | ORAL | 0 refills | 30 days | Status: CP
Start: 2019-11-04 — End: ?

## 2019-11-23 ENCOUNTER — Ambulatory Visit: Admit: 2019-11-23 | Discharge: 2019-11-24 | Payer: MEDICAID

## 2019-12-02 ENCOUNTER — Ambulatory Visit: Admit: 2019-12-02 | Discharge: 2019-12-03 | Payer: MEDICAID

## 2019-12-02 DIAGNOSIS — F321 Major depressive disorder, single episode, moderate: Principal | ICD-10-CM

## 2019-12-02 DIAGNOSIS — F331 Major depressive disorder, recurrent, moderate: Principal | ICD-10-CM

## 2019-12-02 DIAGNOSIS — R7303 Prediabetes: Principal | ICD-10-CM

## 2019-12-02 DIAGNOSIS — F419 Anxiety disorder, unspecified: Principal | ICD-10-CM

## 2019-12-02 DIAGNOSIS — M545 Low back pain: Principal | ICD-10-CM

## 2019-12-02 DIAGNOSIS — M79606 Pain in leg, unspecified: Principal | ICD-10-CM

## 2019-12-02 MED ORDER — VENLAFAXINE ER 75 MG CAPSULE,EXTENDED RELEASE 24 HR
ORAL_CAPSULE | Freq: Every day | ORAL | 2 refills | 30 days | Status: CP
Start: 2019-12-02 — End: 2020-12-01

## 2019-12-02 MED ORDER — GABAPENTIN 300 MG CAPSULE
ORAL_CAPSULE | Freq: Three times a day (TID) | ORAL | 11 refills | 25.00000 days | Status: CP
Start: 2019-12-02 — End: 2019-12-02

## 2019-12-02 MED ORDER — CLONAZEPAM 1 MG TABLET
ORAL_TABLET | Freq: Three times a day (TID) | ORAL | 0 refills | 30.00000 days | Status: CP
Start: 2019-12-02 — End: 2020-12-01

## 2019-12-02 MED ORDER — GABAPENTIN 300 MG CAPSULE: 600 mg | capsule | Freq: Three times a day (TID) | 11 refills | 25 days | Status: AC

## 2019-12-09 ENCOUNTER — Ambulatory Visit: Admit: 2019-12-09 | Discharge: 2019-12-10 | Payer: MEDICAID | Attending: Family Medicine | Primary: Family Medicine

## 2019-12-09 MED ORDER — METHOCARBAMOL 750 MG TABLET
ORAL_TABLET | Freq: Four times a day (QID) | ORAL | 1 refills | 30 days | Status: CP
Start: 2019-12-09 — End: ?

## 2019-12-09 MED ORDER — SULINDAC 200 MG TABLET
ORAL_TABLET | Freq: Two times a day (BID) | ORAL | 1 refills | 30 days | Status: CP
Start: 2019-12-09 — End: 2020-12-08

## 2019-12-17 ENCOUNTER — Ambulatory Visit
Admit: 2019-12-17 | Discharge: 2019-12-18 | Payer: MEDICAID | Attending: Psychiatric/Mental Health | Primary: Psychiatric/Mental Health

## 2019-12-17 DIAGNOSIS — F419 Anxiety disorder, unspecified: Principal | ICD-10-CM

## 2019-12-17 DIAGNOSIS — F321 Major depressive disorder, single episode, moderate: Principal | ICD-10-CM

## 2019-12-17 DIAGNOSIS — F331 Major depressive disorder, recurrent, moderate: Principal | ICD-10-CM

## 2019-12-17 MED ORDER — CLONIDINE HCL 0.1 MG TABLET
ORAL_TABLET | Freq: Two times a day (BID) | ORAL | 1 refills | 30.00000 days | Status: CP
Start: 2019-12-17 — End: 2020-02-15

## 2019-12-17 MED ORDER — VENLAFAXINE 37.5 MG TABLET
ORAL_TABLET | 1 refills | 0 days | Status: CP
Start: 2019-12-17 — End: ?

## 2019-12-23 ENCOUNTER — Ambulatory Visit: Admit: 2019-12-23 | Discharge: 2019-12-24 | Payer: MEDICAID

## 2019-12-27 MED ORDER — CLONAZEPAM 1 MG TABLET
ORAL_TABLET | Freq: Three times a day (TID) | ORAL | 0 refills | 25 days | Status: CP | PRN
Start: 2019-12-27 — End: 2020-01-26

## 2020-01-01 ENCOUNTER — Ambulatory Visit: Admit: 2020-01-01 | Discharge: 2020-01-02 | Payer: MEDICAID

## 2020-01-01 DIAGNOSIS — G8929 Other chronic pain: Secondary | ICD-10-CM

## 2020-01-01 DIAGNOSIS — Z124 Encounter for screening for malignant neoplasm of cervix: Principal | ICD-10-CM

## 2020-01-01 DIAGNOSIS — N898 Other specified noninflammatory disorders of vagina: Principal | ICD-10-CM

## 2020-01-01 DIAGNOSIS — F419 Anxiety disorder, unspecified: Principal | ICD-10-CM

## 2020-01-01 DIAGNOSIS — R030 Elevated blood-pressure reading, without diagnosis of hypertension: Principal | ICD-10-CM

## 2020-01-01 DIAGNOSIS — M545 Low back pain: Principal | ICD-10-CM

## 2020-01-01 DIAGNOSIS — Z113 Encounter for screening for infections with a predominantly sexual mode of transmission: Principal | ICD-10-CM

## 2020-01-01 MED ORDER — NAPROXEN 500 MG TABLET
ORAL_TABLET | Freq: Two times a day (BID) | ORAL | 0 refills | 30.00000 days | Status: CP
Start: 2020-01-01 — End: 2020-01-31

## 2020-01-02 DIAGNOSIS — A599 Trichomoniasis, unspecified: Principal | ICD-10-CM

## 2020-01-02 MED ORDER — METRONIDAZOLE 500 MG TABLET
ORAL_TABLET | Freq: Two times a day (BID) | ORAL | 0 refills | 7 days | Status: CP
Start: 2020-01-02 — End: 2020-01-09

## 2020-01-07 ENCOUNTER — Ambulatory Visit
Admit: 2020-01-07 | Discharge: 2020-01-08 | Payer: MEDICAID | Attending: Psychiatric/Mental Health | Primary: Psychiatric/Mental Health

## 2020-01-07 DIAGNOSIS — F419 Anxiety disorder, unspecified: Principal | ICD-10-CM

## 2020-01-08 ENCOUNTER — Emergency Department: Admit: 2020-01-08 | Discharge: 2020-01-09 | Disposition: A | Discharge status: Home / Self Care | Payer: MEDICAID

## 2020-01-08 ENCOUNTER — Ambulatory Visit: Admit: 2020-01-08 | Discharge: 2020-01-09 | Disposition: A | Discharge status: Home / Self Care | Payer: MEDICAID

## 2020-01-08 DIAGNOSIS — M25572 Pain in left ankle and joints of left foot: Principal | ICD-10-CM

## 2020-01-08 MED ORDER — IBUPROFEN 800 MG TABLET
ORAL_TABLET | Freq: Three times a day (TID) | ORAL | 0 refills | 10.00000 days | Status: CP | PRN
Start: 2020-01-08 — End: ?

## 2020-01-09 MED ORDER — CLONAZEPAM 1 MG TABLET
ORAL_TABLET | Freq: Three times a day (TID) | ORAL | 0 refills | 25.00000 days | Status: CP | PRN
Start: 2020-01-09 — End: 2020-02-08

## 2020-01-12 ENCOUNTER — Ambulatory Visit: Admit: 2020-01-12 | Discharge: 2020-01-13 | Payer: MEDICAID

## 2020-01-17 ENCOUNTER — Ambulatory Visit
Admit: 2020-01-17 | Discharge: 2020-01-18 | Disposition: A | Discharge status: Home / Self Care | Payer: MEDICAID | Attending: Emergency Medicine

## 2020-01-17 ENCOUNTER — Emergency Department
Admit: 2020-01-17 | Discharge: 2020-01-18 | Disposition: A | Discharge status: Home / Self Care | Payer: MEDICAID | Attending: Emergency Medicine

## 2020-01-17 DIAGNOSIS — M79672 Pain in left foot: Principal | ICD-10-CM

## 2020-01-17 MED ORDER — IBUPROFEN 800 MG TABLET
ORAL_TABLET | Freq: Three times a day (TID) | ORAL | 0 refills | 10 days | Status: CP | PRN
Start: 2020-01-17 — End: ?

## 2020-01-26 ENCOUNTER — Ambulatory Visit: Admit: 2020-01-26 | Discharge: 2020-01-27 | Payer: MEDICAID

## 2020-01-26 DIAGNOSIS — S93402A Sprain of unspecified ligament of left ankle, initial encounter: Principal | ICD-10-CM

## 2020-01-28 ENCOUNTER — Ambulatory Visit
Admit: 2020-01-28 | Discharge: 2020-01-29 | Payer: MEDICAID | Attending: Psychiatric/Mental Health | Primary: Psychiatric/Mental Health

## 2020-01-28 MED ORDER — VENLAFAXINE 37.5 MG TABLET
ORAL_TABLET | 0 refills | 0 days | Status: CP
Start: 2020-01-28 — End: ?

## 2020-02-04 ENCOUNTER — Ambulatory Visit: Admit: 2020-02-04 | Discharge: 2020-02-05 | Payer: MEDICAID

## 2020-02-04 DIAGNOSIS — R030 Elevated blood-pressure reading, without diagnosis of hypertension: Principal | ICD-10-CM

## 2020-02-04 DIAGNOSIS — A599 Trichomoniasis, unspecified: Principal | ICD-10-CM

## 2020-02-04 MED ORDER — AMLODIPINE 5 MG TABLET
ORAL_TABLET | Freq: Every day | ORAL | 11 refills | 30.00000 days | Status: CP
Start: 2020-02-04 — End: 2021-02-03

## 2020-02-06 DIAGNOSIS — F419 Anxiety disorder, unspecified: Principal | ICD-10-CM

## 2020-02-06 MED ORDER — CLONAZEPAM 1 MG TABLET
ORAL_TABLET | 0 refills | 0 days
Start: 2020-02-06 — End: ?

## 2020-02-08 DIAGNOSIS — F419 Anxiety disorder, unspecified: Principal | ICD-10-CM

## 2020-02-08 MED ORDER — CLONAZEPAM 1 MG TABLET
ORAL_TABLET | ORAL | 0 refills | 0.00000 days | Status: CP
Start: 2020-02-08 — End: ?

## 2020-02-11 ENCOUNTER — Ambulatory Visit: Admit: 2020-02-11 | Discharge: 2020-02-12 | Payer: MEDICAID

## 2020-02-16 ENCOUNTER — Ambulatory Visit: Admit: 2020-02-16 | Payer: MEDICAID

## 2020-02-24 DIAGNOSIS — F419 Anxiety disorder, unspecified: Principal | ICD-10-CM

## 2020-02-24 MED ORDER — CLONAZEPAM 1 MG TABLET
ORAL_TABLET | 0 refills | 0 days | Status: CP
Start: 2020-02-24 — End: ?

## 2020-03-01 ENCOUNTER — Ambulatory Visit: Admit: 2020-03-01 | Discharge: 2020-03-02 | Payer: MEDICAID

## 2020-03-01 DIAGNOSIS — F419 Anxiety disorder, unspecified: Principal | ICD-10-CM

## 2020-03-01 DIAGNOSIS — R030 Elevated blood-pressure reading, without diagnosis of hypertension: Principal | ICD-10-CM

## 2020-03-01 MED ORDER — CLONAZEPAM 1 MG TABLET
ORAL_TABLET | ORAL | 2 refills | 0.00000 days | Status: CP
Start: 2020-03-01 — End: 2020-03-01

## 2020-03-01 MED ORDER — CLONAZEPAM 1 MG TABLET: tablet | 2 refills | 0 days | Status: AC

## 2020-03-01 MED ORDER — AMLODIPINE 5 MG TABLET
ORAL_TABLET | Freq: Every day | ORAL | 11 refills | 15 days | Status: CP
Start: 2020-03-01 — End: 2021-03-01

## 2020-03-08 ENCOUNTER — Ambulatory Visit: Admit: 2020-03-08 | Discharge: 2020-03-09 | Payer: MEDICAID

## 2020-03-08 DIAGNOSIS — F419 Anxiety disorder, unspecified: Principal | ICD-10-CM

## 2020-03-08 MED ORDER — CLONAZEPAM 1 MG TABLET
ORAL_TABLET | 2 refills | 0 days | Status: CN
Start: 2020-03-08 — End: ?

## 2020-03-08 MED ORDER — AYR SALINE 0.65 % NASAL DROPS
NASAL | 0 refills | 0 days | Status: CP | PRN
Start: 2020-03-08 — End: ?

## 2020-03-14 DIAGNOSIS — F419 Anxiety disorder, unspecified: Principal | ICD-10-CM

## 2020-03-14 MED ORDER — QUETIAPINE 50 MG TABLET
ORAL_TABLET | ORAL | 0 refills | 30 days | Status: CP
Start: 2020-03-14 — End: 2020-04-13

## 2020-03-14 MED ORDER — CLONAZEPAM 1 MG TABLET
ORAL_TABLET | 0 refills | 0 days
Start: 2020-03-14 — End: ?

## 2020-03-15 DIAGNOSIS — F419 Anxiety disorder, unspecified: Principal | ICD-10-CM

## 2020-03-15 MED ORDER — CLONAZEPAM 1 MG TABLET
ORAL_TABLET | 0 refills | 0 days
Start: 2020-03-15 — End: ?

## 2020-03-21 ENCOUNTER — Ambulatory Visit: Admit: 2020-03-21 | Discharge: 2020-03-22 | Payer: MEDICAID | Attending: "Endocrinology | Primary: "Endocrinology

## 2020-03-21 DIAGNOSIS — E063 Autoimmune thyroiditis: Secondary | ICD-10-CM

## 2020-03-21 DIAGNOSIS — E038 Other specified hypothyroidism: Principal | ICD-10-CM

## 2020-03-22 DIAGNOSIS — F419 Anxiety disorder, unspecified: Principal | ICD-10-CM

## 2020-03-22 MED ORDER — LEVOTHYROXINE 200 MCG TABLET
ORAL_TABLET | Freq: Every day | ORAL | 3 refills | 90 days | Status: CP
Start: 2020-03-22 — End: ?

## 2020-03-22 MED ORDER — CLONAZEPAM 1 MG TABLET
ORAL_TABLET | 0 refills | 0 days | Status: CP
Start: 2020-03-22 — End: ?

## 2020-03-24 DIAGNOSIS — E038 Other specified hypothyroidism: Principal | ICD-10-CM

## 2020-03-24 DIAGNOSIS — E063 Autoimmune thyroiditis: Principal | ICD-10-CM

## 2020-03-30 DIAGNOSIS — F419 Anxiety disorder, unspecified: Principal | ICD-10-CM

## 2020-03-30 MED ORDER — CLONAZEPAM 1 MG TABLET
ORAL_TABLET | 0 refills | 0 days | Status: CP
Start: 2020-03-30 — End: ?

## 2020-04-06 ENCOUNTER — Ambulatory Visit: Admit: 2020-04-06 | Discharge: 2020-04-07 | Payer: MEDICAID | Attending: Adult Health | Primary: Adult Health

## 2020-04-06 DIAGNOSIS — F419 Anxiety disorder, unspecified: Principal | ICD-10-CM

## 2020-04-06 MED ORDER — CLONAZEPAM 1 MG TABLET
ORAL_TABLET | 0 refills | 0 days
Start: 2020-04-06 — End: ?

## 2020-04-06 MED ORDER — SULINDAC 200 MG TABLET
ORAL_TABLET | Freq: Two times a day (BID) | ORAL | 1 refills | 30.00000 days | Status: CP
Start: 2020-04-06 — End: 2021-04-06

## 2020-04-06 MED ORDER — METHOCARBAMOL 750 MG TABLET
ORAL_TABLET | Freq: Four times a day (QID) | ORAL | 1 refills | 30 days | Status: CP
Start: 2020-04-06 — End: ?

## 2020-04-07 DIAGNOSIS — F419 Anxiety disorder, unspecified: Principal | ICD-10-CM

## 2020-04-07 MED ORDER — CLONAZEPAM 1 MG TABLET
ORAL_TABLET | ORAL | 0 refills | 0.00000 days | Status: CP
Start: 2020-04-07 — End: ?

## 2020-04-13 DIAGNOSIS — F419 Anxiety disorder, unspecified: Principal | ICD-10-CM

## 2020-04-13 MED ORDER — CLONAZEPAM 1 MG TABLET
ORAL_TABLET | 0 refills | 0 days
Start: 2020-04-13 — End: ?

## 2020-04-14 MED ORDER — CLONAZEPAM 1 MG TABLET
ORAL_TABLET | 1 refills | 0 days | Status: CP
Start: 2020-04-14 — End: ?

## 2020-04-14 MED ORDER — QUETIAPINE 50 MG TABLET
ORAL_TABLET | 0 refills | 0 days | Status: CP
Start: 2020-04-14 — End: ?

## 2020-04-18 ENCOUNTER — Ambulatory Visit: Admit: 2020-04-18 | Discharge: 2020-04-19 | Payer: MEDICAID

## 2020-04-18 MED ORDER — IBUPROFEN 800 MG TABLET
ORAL_TABLET | Freq: Three times a day (TID) | ORAL | 0 refills | 20.00000 days | Status: CP | PRN
Start: 2020-04-18 — End: 2021-04-18

## 2020-04-18 MED ORDER — TIZANIDINE 4 MG TABLET
ORAL_TABLET | Freq: Three times a day (TID) | ORAL | 0 refills | 30.00000 days | Status: CP | PRN
Start: 2020-04-18 — End: 2021-04-18

## 2020-04-18 MED ORDER — AMLODIPINE 10 MG TABLET
ORAL_TABLET | Freq: Every day | ORAL | 11 refills | 30.00000 days | Status: CP
Start: 2020-04-18 — End: 2021-04-18

## 2020-04-21 ENCOUNTER — Ambulatory Visit
Admit: 2020-04-21 | Discharge: 2020-04-22 | Payer: MEDICAID | Attending: Psychiatric/Mental Health | Primary: Psychiatric/Mental Health

## 2020-04-21 DIAGNOSIS — F1199 Opioid use, unspecified with unspecified opioid-induced disorder: Principal | ICD-10-CM

## 2020-04-21 DIAGNOSIS — F4310 Post-traumatic stress disorder, unspecified: Principal | ICD-10-CM

## 2020-04-21 DIAGNOSIS — J4520 Mild intermittent asthma, uncomplicated: Principal | ICD-10-CM

## 2020-04-21 DIAGNOSIS — F1121 Opioid dependence, in remission: Principal | ICD-10-CM

## 2020-04-21 DIAGNOSIS — F411 Generalized anxiety disorder: Principal | ICD-10-CM

## 2020-04-21 MED ORDER — CLONIDINE HCL 0.1 MG TABLET
ORAL_TABLET | Freq: Two times a day (BID) | ORAL | 0 refills | 0.00000 days | Status: CP
Start: 2020-04-21 — End: 2020-06-20

## 2020-04-21 MED ORDER — ALBUTEROL SULFATE HFA 90 MCG/ACTUATION AEROSOL INHALER
Freq: Four times a day (QID) | RESPIRATORY_TRACT | 2 refills | 0 days | Status: CP | PRN
Start: 2020-04-21 — End: 2021-04-21

## 2020-04-21 MED ORDER — CLONIDINE HCL 0.1 MG TABLET: tablet | 0 refills | 0 days | Status: AC

## 2020-04-21 MED ORDER — QUETIAPINE 50 MG TABLET
ORAL_TABLET | ORAL | 1 refills | 0.00000 days | Status: CP
Start: 2020-04-21 — End: ?

## 2020-04-25 ENCOUNTER — Ambulatory Visit: Admit: 2020-04-25 | Discharge: 2020-04-26 | Payer: MEDICAID

## 2020-04-25 ENCOUNTER — Ambulatory Visit
Admit: 2020-04-25 | Discharge: 2020-04-26 | Payer: MEDICAID | Attending: Physical Medicine & Rehabilitation | Primary: Physical Medicine & Rehabilitation

## 2020-04-25 DIAGNOSIS — M545 Low back pain: Principal | ICD-10-CM

## 2020-04-25 DIAGNOSIS — G8929 Other chronic pain: Secondary | ICD-10-CM

## 2020-04-25 DIAGNOSIS — M542 Cervicalgia: Principal | ICD-10-CM

## 2020-04-25 DIAGNOSIS — R202 Paresthesia of skin: Principal | ICD-10-CM

## 2020-04-28 DIAGNOSIS — F419 Anxiety disorder, unspecified: Principal | ICD-10-CM

## 2020-04-28 MED ORDER — CLONAZEPAM 1 MG TABLET
ORAL_TABLET | 0 refills | 0 days | Status: CP
Start: 2020-04-28 — End: ?

## 2020-05-04 DIAGNOSIS — F419 Anxiety disorder, unspecified: Principal | ICD-10-CM

## 2020-05-04 MED ORDER — CLONAZEPAM 1 MG TABLET
ORAL_TABLET | 0 refills | 0 days
Start: 2020-05-04 — End: ?

## 2020-05-12 ENCOUNTER — Ambulatory Visit
Admit: 2020-05-12 | Discharge: 2020-05-13 | Payer: MEDICAID | Attending: Psychiatric/Mental Health | Primary: Psychiatric/Mental Health

## 2020-05-12 ENCOUNTER — Ambulatory Visit: Admit: 2020-05-12 | Discharge: 2020-05-13 | Payer: MEDICAID

## 2020-05-12 DIAGNOSIS — F419 Anxiety disorder, unspecified: Principal | ICD-10-CM

## 2020-05-12 DIAGNOSIS — F4310 Post-traumatic stress disorder, unspecified: Principal | ICD-10-CM

## 2020-05-12 DIAGNOSIS — M545 Low back pain: Principal | ICD-10-CM

## 2020-05-12 DIAGNOSIS — G8929 Other chronic pain: Principal | ICD-10-CM

## 2020-05-12 MED ORDER — QUETIAPINE 50 MG TABLET
ORAL_TABLET | 1 refills | 0 days | Status: CP
Start: 2020-05-12 — End: ?

## 2020-05-12 MED ORDER — IBUPROFEN 800 MG TABLET
ORAL_TABLET | 0 refills | 0 days | Status: CP
Start: 2020-05-12 — End: ?

## 2020-05-12 MED ORDER — CLONAZEPAM 1 MG TABLET
ORAL_TABLET | 1 refills | 0 days | Status: CP
Start: 2020-05-12 — End: ?

## 2020-05-12 MED ORDER — TIZANIDINE 4 MG TABLET
ORAL_TABLET | 0 refills | 0 days | Status: CP
Start: 2020-05-12 — End: ?

## 2020-05-17 ENCOUNTER — Ambulatory Visit: Admit: 2020-05-17 | Discharge: 2020-05-18 | Payer: MEDICAID

## 2020-05-18 ENCOUNTER — Ambulatory Visit
Admit: 2020-05-18 | Discharge: 2020-05-19 | Payer: MEDICAID | Attending: Addiction (Substance Use Disorder) | Primary: Addiction (Substance Use Disorder)

## 2020-05-23 ENCOUNTER — Ambulatory Visit: Admit: 2020-05-23 | Discharge: 2020-05-24 | Payer: MEDICAID

## 2020-05-23 DIAGNOSIS — I10 Essential (primary) hypertension: Principal | ICD-10-CM

## 2020-05-23 DIAGNOSIS — M545 Low back pain: Principal | ICD-10-CM

## 2020-05-23 DIAGNOSIS — M79606 Pain in leg, unspecified: Principal | ICD-10-CM

## 2020-05-23 DIAGNOSIS — F419 Anxiety disorder, unspecified: Principal | ICD-10-CM

## 2020-05-23 DIAGNOSIS — G8929 Other chronic pain: Secondary | ICD-10-CM

## 2020-05-23 MED ORDER — GABAPENTIN 300 MG CAPSULE
ORAL_CAPSULE | Freq: Three times a day (TID) | ORAL | 2 refills | 30 days | Status: CP
Start: 2020-05-23 — End: 2020-08-21

## 2020-05-24 DIAGNOSIS — R748 Abnormal levels of other serum enzymes: Principal | ICD-10-CM

## 2020-06-02 ENCOUNTER — Ambulatory Visit
Admit: 2020-06-02 | Discharge: 2020-06-03 | Payer: MEDICAID | Attending: Addiction (Substance Use Disorder) | Primary: Addiction (Substance Use Disorder)

## 2020-06-06 ENCOUNTER — Encounter: Admit: 2020-06-06 | Discharge: 2020-06-07 | Payer: MEDICAID | Attending: Family Medicine | Primary: Family Medicine

## 2020-06-06 MED ORDER — BACLOFEN 10 MG TABLET
ORAL_TABLET | Freq: Three times a day (TID) | ORAL | 2 refills | 30 days | Status: CP
Start: 2020-06-06 — End: 2021-06-06

## 2020-06-09 ENCOUNTER — Encounter: Admit: 2020-06-09 | Discharge: 2020-06-10 | Disposition: A | Discharge status: Home / Self Care | Payer: MEDICAID

## 2020-06-09 ENCOUNTER — Encounter
Admit: 2020-06-09 | Discharge: 2020-06-10 | Disposition: A | Discharge status: Home / Self Care | Payer: PRIVATE HEALTH INSURANCE

## 2020-06-09 DIAGNOSIS — G8929 Other chronic pain: Secondary | ICD-10-CM

## 2020-06-09 DIAGNOSIS — M549 Dorsalgia, unspecified: Principal | ICD-10-CM

## 2020-06-09 DIAGNOSIS — W19XXXA Unspecified fall, initial encounter: Principal | ICD-10-CM

## 2020-06-09 DIAGNOSIS — R0781 Pleurodynia: Principal | ICD-10-CM

## 2020-06-10 ENCOUNTER — Ambulatory Visit: Admit: 2020-06-10 | Discharge: 2020-06-11 | Payer: MEDICAID

## 2020-06-13 DIAGNOSIS — F419 Anxiety disorder, unspecified: Principal | ICD-10-CM

## 2020-06-13 DIAGNOSIS — G8929 Other chronic pain: Principal | ICD-10-CM

## 2020-06-13 DIAGNOSIS — M545 Low back pain: Principal | ICD-10-CM

## 2020-06-13 MED ORDER — CLONAZEPAM 1 MG TABLET: tablet | 1 refills | 0 days | Status: AC

## 2020-06-13 MED ORDER — CLONAZEPAM 1 MG TABLET
ORAL_TABLET | ORAL | 1 refills | 0.00000 days | Status: CP
Start: 2020-06-13 — End: ?

## 2020-06-14 ENCOUNTER — Institutional Professional Consult (permissible substitution): Admit: 2020-06-14 | Discharge: 2020-06-15 | Payer: MEDICAID

## 2020-06-14 ENCOUNTER — Ambulatory Visit: Admit: 2020-06-14 | Discharge: 2020-06-15 | Payer: MEDICAID

## 2020-06-14 MED ORDER — ACETAMINOPHEN 500 MG TABLET
ORAL_TABLET | Freq: Four times a day (QID) | ORAL | 0 refills | 20 days | Status: CP | PRN
Start: 2020-06-14 — End: 2020-07-04

## 2020-06-14 MED ORDER — IBUPROFEN 800 MG TABLET
ORAL_TABLET | Freq: Three times a day (TID) | ORAL | 0 refills | 20.00000 days | Status: CP | PRN
Start: 2020-06-14 — End: ?

## 2020-06-14 MED ORDER — IBUPROFEN 800 MG TABLET: 800 mg | tablet | Freq: Three times a day (TID) | 0 refills | 20 days | Status: AC

## 2020-06-14 MED ORDER — LIDOCAINE 5 % TOPICAL PATCH
MEDICATED_PATCH | TRANSDERMAL | 0 refills | 30 days | Status: CP
Start: 2020-06-14 — End: 2020-07-14

## 2020-06-15 ENCOUNTER — Ambulatory Visit
Admit: 2020-06-15 | Discharge: 2020-06-16 | Attending: Addiction (Substance Use Disorder) | Primary: Addiction (Substance Use Disorder)

## 2020-06-20 ENCOUNTER — Ambulatory Visit: Admit: 2020-06-20 | Discharge: 2020-06-21 | Payer: MEDICAID

## 2020-06-20 ENCOUNTER — Ambulatory Visit
Admit: 2020-06-20 | Discharge: 2020-06-21 | Payer: MEDICAID | Attending: Physical Medicine & Rehabilitation | Primary: Physical Medicine & Rehabilitation

## 2020-06-20 DIAGNOSIS — F419 Anxiety disorder, unspecified: Principal | ICD-10-CM

## 2020-06-20 DIAGNOSIS — M5136 Other intervertebral disc degeneration, lumbar region: Principal | ICD-10-CM

## 2020-06-20 MED ORDER — CLONAZEPAM 1 MG TABLET
ORAL_TABLET | 1 refills | 0 days
Start: 2020-06-20 — End: ?

## 2020-06-21 DIAGNOSIS — F419 Anxiety disorder, unspecified: Principal | ICD-10-CM

## 2020-06-21 MED ORDER — CLONAZEPAM 1 MG TABLET
ORAL_TABLET | 0 refills | 0 days
Start: 2020-06-21 — End: ?

## 2020-06-23 ENCOUNTER — Ambulatory Visit
Admit: 2020-06-23 | Discharge: 2020-06-24 | Payer: MEDICAID | Attending: Psychiatric/Mental Health | Primary: Psychiatric/Mental Health

## 2020-06-23 DIAGNOSIS — F321 Major depressive disorder, single episode, moderate: Principal | ICD-10-CM

## 2020-06-23 DIAGNOSIS — F419 Anxiety disorder, unspecified: Principal | ICD-10-CM

## 2020-06-23 MED ORDER — CLONAZEPAM 1 MG TABLET
ORAL_TABLET | 0 refills | 0 days | Status: CP
Start: 2020-06-23 — End: ?

## 2020-06-23 MED ORDER — BUPROPION HCL SR 150 MG TABLET,12 HR SUSTAINED-RELEASE: 150 mg | tablet | Freq: Two times a day (BID) | 3 refills | 90 days | Status: AC

## 2020-06-23 MED ORDER — BUPROPION HCL SR 150 MG TABLET,12 HR SUSTAINED-RELEASE
ORAL_TABLET | Freq: Two times a day (BID) | ORAL | 3 refills | 90.00000 days | Status: CP
Start: 2020-06-23 — End: 2020-06-23

## 2020-06-24 ENCOUNTER — Ambulatory Visit: Admit: 2020-06-24 | Discharge: 2020-06-25 | Payer: MEDICAID

## 2020-06-24 MED ORDER — CLONAZEPAM 1 MG TABLET
ORAL_TABLET | 0 refills | 0 days
Start: 2020-06-24 — End: ?

## 2020-06-27 ENCOUNTER — Ambulatory Visit: Admit: 2020-06-27 | Discharge: 2020-06-27 | Payer: MEDICAID

## 2020-06-27 DIAGNOSIS — M545 Chronic bilateral low back pain without sciatica: Principal | ICD-10-CM

## 2020-06-27 DIAGNOSIS — F321 Major depressive disorder, single episode, moderate: Principal | ICD-10-CM

## 2020-06-27 DIAGNOSIS — G8929 Other chronic pain: Principal | ICD-10-CM

## 2020-06-27 DIAGNOSIS — K760 Fatty (change of) liver, not elsewhere classified: Principal | ICD-10-CM

## 2020-06-27 DIAGNOSIS — R748 Abnormal levels of other serum enzymes: Principal | ICD-10-CM

## 2020-06-27 MED ORDER — ACETAMINOPHEN 500 MG TABLET
ORAL_TABLET | Freq: Four times a day (QID) | ORAL | 0 refills | 20.00000 days | Status: CP | PRN
Start: 2020-06-27 — End: 2020-07-17

## 2020-06-27 MED ORDER — IBUPROFEN 800 MG TABLET
ORAL_TABLET | Freq: Three times a day (TID) | ORAL | 0 refills | 20 days | Status: CP | PRN
Start: 2020-06-27 — End: ?

## 2020-06-27 MED ORDER — BUPROPION HCL SR 150 MG TABLET,12 HR SUSTAINED-RELEASE
ORAL_TABLET | Freq: Two times a day (BID) | ORAL | 0 refills | 90.00000 days | Status: CN
Start: 2020-06-27 — End: 2020-09-25

## 2020-06-28 DIAGNOSIS — R7989 Other specified abnormal findings of blood chemistry: Principal | ICD-10-CM

## 2020-06-29 ENCOUNTER — Ambulatory Visit: Admit: 2020-06-29 | Payer: MEDICAID

## 2020-06-30 ENCOUNTER — Ambulatory Visit
Admit: 2020-06-30 | Discharge: 2020-07-01 | Payer: MEDICAID | Attending: Addiction (Substance Use Disorder) | Primary: Addiction (Substance Use Disorder)

## 2020-07-01 ENCOUNTER — Ambulatory Visit: Admit: 2020-07-01 | Discharge: 2020-07-02 | Payer: MEDICAID

## 2020-07-01 ENCOUNTER — Ambulatory Visit: Admit: 2020-07-01 | Discharge: 2020-07-02 | Payer: MEDICAID | Attending: Family Medicine | Primary: Family Medicine

## 2020-07-01 MED ORDER — PREDNISONE 20 MG TABLET
ORAL_TABLET | Freq: Every day | ORAL | 0 refills | 7 days | Status: CP
Start: 2020-07-01 — End: 2020-07-08

## 2020-07-01 MED ORDER — OXYCODONE 5 MG TABLET
ORAL_TABLET | Freq: Three times a day (TID) | ORAL | 0 refills | 4 days | Status: CP | PRN
Start: 2020-07-01 — End: 2020-07-05

## 2020-07-01 MED ORDER — GABAPENTIN 600 MG TABLET
ORAL_TABLET | Freq: Three times a day (TID) | ORAL | 11 refills | 30 days | Status: CP
Start: 2020-07-01 — End: 2021-07-01

## 2020-07-01 MED ORDER — KETOROLAC 60 MG/2 ML INTRAMUSCULAR SOLUTION
Freq: Once | INTRAMUSCULAR | 0 refills | 1 days | Status: CP
Start: 2020-07-01 — End: 2020-07-01

## 2020-07-04 ENCOUNTER — Ambulatory Visit
Admit: 2020-07-04 | Discharge: 2020-07-05 | Payer: MEDICAID | Attending: Physical Medicine & Rehabilitation | Primary: Physical Medicine & Rehabilitation

## 2020-07-04 ENCOUNTER — Ambulatory Visit: Admit: 2020-07-04 | Discharge: 2020-07-05 | Payer: MEDICAID

## 2020-07-04 DIAGNOSIS — M5417 Radiculopathy, lumbosacral region: Principal | ICD-10-CM

## 2020-07-04 DIAGNOSIS — M25562 Pain in left knee: Principal | ICD-10-CM

## 2020-07-04 DIAGNOSIS — F321 Major depressive disorder, single episode, moderate: Principal | ICD-10-CM

## 2020-07-04 MED ORDER — BUPROPION HCL SR 150 MG TABLET,12 HR SUSTAINED-RELEASE
0 refills | 0 days
Start: 2020-07-04 — End: ?

## 2020-07-08 ENCOUNTER — Ambulatory Visit
Admit: 2020-07-08 | Discharge: 2020-07-09 | Payer: MEDICAID | Attending: Psychiatric/Mental Health | Primary: Psychiatric/Mental Health

## 2020-07-08 ENCOUNTER — Ambulatory Visit: Admit: 2020-07-08 | Discharge: 2020-07-09 | Payer: MEDICAID

## 2020-07-08 DIAGNOSIS — F419 Anxiety disorder, unspecified: Principal | ICD-10-CM

## 2020-07-08 MED ORDER — SILVER SULFADIAZINE 1 % TOPICAL CREAM
1 refills | 0 days | Status: CP
Start: 2020-07-08 — End: 2021-07-08

## 2020-07-08 MED ORDER — KETOROLAC 60 MG/2 ML INTRAMUSCULAR SOLUTION
Freq: Once | INTRAMUSCULAR | 0 refills | 1 days | Status: CP
Start: 2020-07-08 — End: 2020-07-08

## 2020-07-08 MED ORDER — CLONAZEPAM 1 MG TABLET
ORAL_TABLET | 0 refills | 0 days | Status: CP
Start: 2020-07-08 — End: ?

## 2020-07-08 MED ORDER — QUETIAPINE 50 MG TABLET
ORAL_TABLET | 1 refills | 0 days | Status: CP
Start: 2020-07-08 — End: ?

## 2020-07-10 DIAGNOSIS — F419 Anxiety disorder, unspecified: Principal | ICD-10-CM

## 2020-07-10 MED ORDER — CLONAZEPAM 1 MG TABLET
ORAL_TABLET | 0 refills | 0 days
Start: 2020-07-10 — End: ?

## 2020-07-11 MED ORDER — CLONAZEPAM 1 MG TABLET
ORAL_TABLET | 0 refills | 0 days
Start: 2020-07-11 — End: ?

## 2020-07-12 ENCOUNTER — Ambulatory Visit
Admit: 2020-07-12 | Discharge: 2020-07-13 | Payer: MEDICAID | Attending: Student in an Organized Health Care Education/Training Program | Primary: Student in an Organized Health Care Education/Training Program

## 2020-07-12 MED ORDER — KETOROLAC 60 MG/2 ML INTRAMUSCULAR SOLUTION: 60 mg | mL | Freq: Once | 0 refills | 2 days | Status: AC

## 2020-07-12 MED ORDER — KETOROLAC 60 MG/2 ML INTRAMUSCULAR SOLUTION
Freq: Once | INTRAMUSCULAR | 0 refills | 2.00000 days | Status: CP
Start: 2020-07-12 — End: 2020-07-12

## 2020-07-14 ENCOUNTER — Ambulatory Visit
Admit: 2020-07-14 | Discharge: 2020-07-15 | Payer: MEDICAID | Attending: Addiction (Substance Use Disorder) | Primary: Addiction (Substance Use Disorder)

## 2020-07-15 ENCOUNTER — Ambulatory Visit: Admit: 2020-07-15 | Discharge: 2020-07-16 | Payer: MEDICAID

## 2020-07-15 ENCOUNTER — Ambulatory Visit
Admit: 2020-07-15 | Discharge: 2020-07-16 | Payer: MEDICAID | Attending: Physical Medicine & Rehabilitation | Primary: Physical Medicine & Rehabilitation

## 2020-07-15 DIAGNOSIS — M25562 Pain in left knee: Principal | ICD-10-CM

## 2020-07-16 DIAGNOSIS — E041 Nontoxic single thyroid nodule: Principal | ICD-10-CM

## 2020-07-18 ENCOUNTER — Ambulatory Visit: Admit: 2020-07-18 | Discharge: 2020-07-19 | Payer: MEDICAID

## 2020-07-18 ENCOUNTER — Ambulatory Visit
Admit: 2020-07-18 | Discharge: 2020-07-19 | Payer: MEDICAID | Attending: Physical Medicine & Rehabilitation | Primary: Physical Medicine & Rehabilitation

## 2020-07-18 DIAGNOSIS — M5416 Radiculopathy, lumbar region: Principal | ICD-10-CM

## 2020-07-20 ENCOUNTER — Ambulatory Visit: Admit: 2020-07-20 | Discharge: 2020-07-21 | Payer: MEDICAID

## 2020-07-20 ENCOUNTER — Ambulatory Visit
Admit: 2020-07-20 | Discharge: 2020-07-21 | Payer: MEDICAID | Attending: Orthopaedic Surgery | Primary: Orthopaedic Surgery

## 2020-07-20 DIAGNOSIS — F419 Anxiety disorder, unspecified: Principal | ICD-10-CM

## 2020-07-20 DIAGNOSIS — K838 Other specified diseases of biliary tract: Principal | ICD-10-CM

## 2020-07-20 DIAGNOSIS — M24542 Contracture, left hand: Principal | ICD-10-CM

## 2020-07-20 MED ORDER — CLONAZEPAM 1 MG TABLET
ORAL_TABLET | 0 refills | 0 days
Start: 2020-07-20 — End: ?

## 2020-07-21 DIAGNOSIS — R160 Hepatomegaly, not elsewhere classified: Principal | ICD-10-CM

## 2020-07-21 MED ORDER — CLONAZEPAM 1 MG TABLET
ORAL_TABLET | 0 refills | 0 days | Status: CP
Start: 2020-07-21 — End: ?

## 2020-07-25 ENCOUNTER — Ambulatory Visit: Admit: 2020-07-25 | Discharge: 2020-07-26 | Payer: MEDICAID

## 2020-07-25 DIAGNOSIS — R748 Abnormal levels of other serum enzymes: Principal | ICD-10-CM

## 2020-07-25 DIAGNOSIS — G8929 Other chronic pain: Principal | ICD-10-CM

## 2020-07-25 DIAGNOSIS — I10 Essential (primary) hypertension: Principal | ICD-10-CM

## 2020-07-25 DIAGNOSIS — M5440 Lumbago with sciatica, unspecified side: Principal | ICD-10-CM

## 2020-07-25 DIAGNOSIS — K760 Fatty (change of) liver, not elsewhere classified: Principal | ICD-10-CM

## 2020-07-25 MED ORDER — HYDROCHLOROTHIAZIDE 12.5 MG TABLET
ORAL_TABLET | Freq: Every day | ORAL | 11 refills | 30.00000 days | Status: CP
Start: 2020-07-25 — End: 2021-07-25

## 2020-07-26 ENCOUNTER — Ambulatory Visit
Admit: 2020-07-26 | Discharge: 2020-07-27 | Payer: MEDICAID | Attending: Physician Assistant | Primary: Physician Assistant

## 2020-07-26 DIAGNOSIS — G8929 Other chronic pain: Principal | ICD-10-CM

## 2020-07-26 DIAGNOSIS — Z01812 Encounter for preprocedural laboratory examination: Principal | ICD-10-CM

## 2020-07-26 DIAGNOSIS — M545 Chronic bilateral low back pain without sciatica: Principal | ICD-10-CM

## 2020-07-26 DIAGNOSIS — M5442 Lumbago with sciatica, left side: Principal | ICD-10-CM

## 2020-07-26 DIAGNOSIS — Z20822 Contact with and (suspected) exposure to covid-19: Principal | ICD-10-CM

## 2020-07-26 DIAGNOSIS — M5441 Lumbago with sciatica, right side: Principal | ICD-10-CM

## 2020-07-26 MED ORDER — GABAPENTIN 300 MG CAPSULE
ORAL_CAPSULE | Freq: Three times a day (TID) | ORAL | 11 refills | 30.00000 days | Status: CP
Start: 2020-07-26 — End: 2021-07-26
  Filled 2020-07-27: qty 360, 30d supply, fill #0

## 2020-07-26 MED ORDER — QUETIAPINE 50 MG TABLET
ORAL_TABLET | ORAL | 1 refills | 0.00000 days | Status: CP
Start: 2020-07-26 — End: 2020-08-11
  Filled 2020-07-27: qty 120, 30d supply, fill #0

## 2020-07-26 MED ORDER — BACLOFEN 10 MG TABLET
ORAL_TABLET | Freq: Three times a day (TID) | ORAL | 2 refills | 30 days | Status: CP
Start: 2020-07-26 — End: 2021-07-26
  Filled 2020-07-27: qty 90, 30d supply, fill #0

## 2020-07-26 MED ORDER — IBUPROFEN 800 MG TABLET
ORAL_TABLET | Freq: Three times a day (TID) | ORAL | 0 refills | 20 days | Status: CP | PRN
Start: 2020-07-26 — End: 2020-08-19
  Filled 2020-07-27: qty 60, 20d supply, fill #0

## 2020-07-27 DIAGNOSIS — E669 Obesity, unspecified: Principal | ICD-10-CM

## 2020-07-27 DIAGNOSIS — Z5900 Homelessness unspecified: Principal | ICD-10-CM

## 2020-07-27 DIAGNOSIS — T8484XA Pain due to internal orthopedic prosthetic devices, implants and grafts, initial encounter: Principal | ICD-10-CM

## 2020-07-27 DIAGNOSIS — Z96692 Finger-joint replacement of left hand: Principal | ICD-10-CM

## 2020-07-27 DIAGNOSIS — G8918 Other acute postprocedural pain: Principal | ICD-10-CM

## 2020-07-27 DIAGNOSIS — Z6841 Body Mass Index (BMI) 40.0 and over, adult: Principal | ICD-10-CM

## 2020-07-27 DIAGNOSIS — M79645 Pain in left finger(s): Principal | ICD-10-CM

## 2020-07-27 DIAGNOSIS — E039 Hypothyroidism, unspecified: Principal | ICD-10-CM

## 2020-07-27 DIAGNOSIS — M24542 Contracture, left hand: Principal | ICD-10-CM

## 2020-07-27 DIAGNOSIS — N189 Chronic kidney disease, unspecified: Principal | ICD-10-CM

## 2020-07-27 DIAGNOSIS — J45909 Unspecified asthma, uncomplicated: Principal | ICD-10-CM

## 2020-07-27 DIAGNOSIS — Z87891 Personal history of nicotine dependence: Principal | ICD-10-CM

## 2020-07-27 DIAGNOSIS — F419 Anxiety disorder, unspecified: Principal | ICD-10-CM

## 2020-07-27 DIAGNOSIS — I129 Hypertensive chronic kidney disease with stage 1 through stage 4 chronic kidney disease, or unspecified chronic kidney disease: Principal | ICD-10-CM

## 2020-07-27 MED FILL — IBUPROFEN 800 MG TABLET: 20 days supply | Qty: 60 | Fill #0 | Status: AC

## 2020-07-27 MED FILL — QUETIAPINE 50 MG TABLET: 30 days supply | Qty: 120 | Fill #0 | Status: AC

## 2020-07-27 MED FILL — GABAPENTIN 300 MG CAPSULE: 30 days supply | Qty: 360 | Fill #0 | Status: AC

## 2020-07-27 MED FILL — BACLOFEN 10 MG TABLET: 30 days supply | Qty: 90 | Fill #0 | Status: AC

## 2020-07-28 ENCOUNTER — Ambulatory Visit: Admit: 2020-07-28 | Discharge: 2020-07-28 | Payer: MEDICAID

## 2020-07-28 ENCOUNTER — Encounter: Admit: 2020-07-28 | Discharge: 2020-07-28 | Payer: MEDICAID

## 2020-07-28 DIAGNOSIS — F419 Anxiety disorder, unspecified: Principal | ICD-10-CM

## 2020-07-28 DIAGNOSIS — E669 Obesity, unspecified: Principal | ICD-10-CM

## 2020-07-28 DIAGNOSIS — Z87891 Personal history of nicotine dependence: Principal | ICD-10-CM

## 2020-07-28 DIAGNOSIS — T8484XA Pain due to internal orthopedic prosthetic devices, implants and grafts, initial encounter: Principal | ICD-10-CM

## 2020-07-28 DIAGNOSIS — Z6841 Body Mass Index (BMI) 40.0 and over, adult: Principal | ICD-10-CM

## 2020-07-28 DIAGNOSIS — Z5900 Homelessness unspecified: Principal | ICD-10-CM

## 2020-07-28 DIAGNOSIS — J45909 Unspecified asthma, uncomplicated: Principal | ICD-10-CM

## 2020-07-28 DIAGNOSIS — G8918 Other acute postprocedural pain: Principal | ICD-10-CM

## 2020-07-28 DIAGNOSIS — M79645 Pain in left finger(s): Principal | ICD-10-CM

## 2020-07-28 DIAGNOSIS — Z96692 Finger-joint replacement of left hand: Principal | ICD-10-CM

## 2020-07-28 DIAGNOSIS — E039 Hypothyroidism, unspecified: Principal | ICD-10-CM

## 2020-07-28 DIAGNOSIS — N189 Chronic kidney disease, unspecified: Principal | ICD-10-CM

## 2020-07-28 DIAGNOSIS — M24542 Contracture, left hand: Principal | ICD-10-CM

## 2020-07-28 DIAGNOSIS — I129 Hypertensive chronic kidney disease with stage 1 through stage 4 chronic kidney disease, or unspecified chronic kidney disease: Principal | ICD-10-CM

## 2020-07-28 MED ORDER — OXYCODONE 5 MG TABLET
ORAL_TABLET | ORAL | 0 refills | 2.00000 days | Status: CP | PRN
Start: 2020-07-28 — End: 2020-08-02

## 2020-07-28 MED ORDER — ACETAMINOPHEN 500 MG TABLET
ORAL_TABLET | Freq: Four times a day (QID) | ORAL | 0 refills | 8 days | Status: CP
Start: 2020-07-28 — End: ?

## 2020-07-31 MED ORDER — LIRAGLUTIDE (WEIGHT LOSS) 3 MG/0.5 ML (18 MG/3 ML) SUBCUT PEN INJECTOR
SUBCUTANEOUS | 0 refills | 35.00000 days | Status: CP
Start: 2020-07-31 — End: 2020-08-05

## 2020-07-31 MED ORDER — SAXENDA 3 MG/0.5 ML (18 MG/3 ML) SUBCUTANEOUS PEN INJECTOR
0 refills | 0 days
Start: 2020-07-31 — End: ?

## 2020-08-01 MED ORDER — SAXENDA 3 MG/0.5 ML (18 MG/3 ML) SUBCUTANEOUS PEN INJECTOR
0 refills | 0 days
Start: 2020-08-01 — End: ?

## 2020-08-02 DIAGNOSIS — F419 Anxiety disorder, unspecified: Principal | ICD-10-CM

## 2020-08-02 MED ORDER — CLONAZEPAM 1 MG TABLET
ORAL_TABLET | ORAL | 0 refills | 0.00000 days | Status: CP
Start: 2020-08-02 — End: 2020-08-11

## 2020-08-05 MED ORDER — LIRAGLUTIDE 0.6 MG/0.1 ML (18 MG/3 ML) SUBCUTANEOUS PEN INJECTOR
SUBCUTANEOUS | 0 refills | 28.00000 days | Status: CP
Start: 2020-08-05 — End: 2020-09-02

## 2020-08-05 MED ORDER — METFORMIN 500 MG TABLET
ORAL_TABLET | Freq: Two times a day (BID) | ORAL | 3 refills | 90.00000 days | Status: CP
Start: 2020-08-05 — End: 2021-08-05

## 2020-08-08 DIAGNOSIS — F419 Anxiety disorder, unspecified: Principal | ICD-10-CM

## 2020-08-10 ENCOUNTER — Ambulatory Visit
Admit: 2020-08-10 | Discharge: 2020-08-11 | Payer: MEDICAID | Attending: Orthopaedic Surgery | Primary: Orthopaedic Surgery

## 2020-08-10 DIAGNOSIS — Z9889 Other specified postprocedural states: Principal | ICD-10-CM

## 2020-08-10 MED ORDER — PEN NEEDLE, DIABETIC 32 GAUGE X 5/32" (4 MM)
Freq: Every day | 1 refills | 100.00000 days | Status: CP
Start: 2020-08-10 — End: ?

## 2020-08-11 ENCOUNTER — Ambulatory Visit
Admit: 2020-08-11 | Discharge: 2020-08-11 | Payer: MEDICAID | Attending: Addiction (Substance Use Disorder) | Primary: Addiction (Substance Use Disorder)

## 2020-08-11 ENCOUNTER — Ambulatory Visit
Admit: 2020-08-11 | Discharge: 2020-08-11 | Payer: MEDICAID | Attending: Psychiatric/Mental Health | Primary: Psychiatric/Mental Health

## 2020-08-11 DIAGNOSIS — F1129 Opioid dependence with unspecified opioid-induced disorder: Principal | ICD-10-CM

## 2020-08-11 DIAGNOSIS — F4310 Post-traumatic stress disorder, unspecified: Principal | ICD-10-CM

## 2020-08-11 DIAGNOSIS — F419 Anxiety disorder, unspecified: Principal | ICD-10-CM

## 2020-08-11 DIAGNOSIS — F1121 Opioid dependence, in remission: Principal | ICD-10-CM

## 2020-08-11 DIAGNOSIS — F331 Major depressive disorder, recurrent, moderate: Principal | ICD-10-CM

## 2020-08-11 MED ORDER — QUETIAPINE 25 MG TABLET
ORAL_TABLET | Freq: Two times a day (BID) | ORAL | 0 refills | 90.00000 days | Status: CP
Start: 2020-08-11 — End: 2020-09-30

## 2020-08-11 MED ORDER — QUETIAPINE 200 MG TABLET
ORAL_TABLET | 0 refills | 0 days | Status: CP
Start: 2020-08-11 — End: 2020-09-08

## 2020-08-11 MED ORDER — CLONAZEPAM 1 MG TABLET
ORAL_TABLET | ORAL | 0 refills | 28 days | Status: CP
Start: 2020-08-11 — End: 2020-09-08

## 2020-08-15 ENCOUNTER — Ambulatory Visit: Admit: 2020-08-15 | Discharge: 2020-08-16 | Payer: MEDICAID

## 2020-08-15 ENCOUNTER — Ambulatory Visit
Admit: 2020-08-15 | Discharge: 2020-08-16 | Payer: MEDICAID | Attending: Physical Medicine & Rehabilitation | Primary: Physical Medicine & Rehabilitation

## 2020-08-15 DIAGNOSIS — Z791 Long term (current) use of non-steroidal anti-inflammatories (NSAID): Principal | ICD-10-CM

## 2020-08-15 DIAGNOSIS — Z79890 Hormone replacement therapy (postmenopausal): Principal | ICD-10-CM

## 2020-08-15 DIAGNOSIS — E079 Disorder of thyroid, unspecified: Principal | ICD-10-CM

## 2020-08-15 DIAGNOSIS — M1711 Unilateral primary osteoarthritis, right knee: Principal | ICD-10-CM

## 2020-08-15 DIAGNOSIS — Z79891 Long term (current) use of opiate analgesic: Principal | ICD-10-CM

## 2020-08-15 DIAGNOSIS — J45909 Unspecified asthma, uncomplicated: Principal | ICD-10-CM

## 2020-08-15 DIAGNOSIS — M48061 Spinal stenosis, lumbar region without neurogenic claudication: Principal | ICD-10-CM

## 2020-08-15 DIAGNOSIS — M5417 Radiculopathy, lumbosacral region: Principal | ICD-10-CM

## 2020-08-15 DIAGNOSIS — Z87891 Personal history of nicotine dependence: Principal | ICD-10-CM

## 2020-08-15 DIAGNOSIS — M25561 Pain in right knee: Principal | ICD-10-CM

## 2020-08-15 DIAGNOSIS — M5136 Other intervertebral disc degeneration, lumbar region: Principal | ICD-10-CM

## 2020-08-15 DIAGNOSIS — M50323 Other cervical disc degeneration at C6-C7 level: Principal | ICD-10-CM

## 2020-08-15 DIAGNOSIS — Z5900 Homelessness unspecified: Principal | ICD-10-CM

## 2020-08-15 DIAGNOSIS — G8929 Other chronic pain: Principal | ICD-10-CM

## 2020-08-15 DIAGNOSIS — F419 Anxiety disorder, unspecified: Principal | ICD-10-CM

## 2020-08-15 DIAGNOSIS — M47816 Spondylosis without myelopathy or radiculopathy, lumbar region: Principal | ICD-10-CM

## 2020-08-15 DIAGNOSIS — M47812 Spondylosis without myelopathy or radiculopathy, cervical region: Principal | ICD-10-CM

## 2020-08-15 DIAGNOSIS — M25562 Pain in left knee: Principal | ICD-10-CM

## 2020-08-19 DIAGNOSIS — M5442 Lumbago with sciatica, left side: Principal | ICD-10-CM

## 2020-08-19 DIAGNOSIS — M545 Chronic bilateral low back pain without sciatica: Principal | ICD-10-CM

## 2020-08-19 DIAGNOSIS — G8929 Other chronic pain: Principal | ICD-10-CM

## 2020-08-19 MED ORDER — GABAPENTIN 600 MG TABLET
ORAL_TABLET | Freq: Three times a day (TID) | ORAL | 11 refills | 30 days | Status: CP
Start: 2020-08-19 — End: 2020-08-19

## 2020-08-19 MED ORDER — IBUPROFEN 800 MG TABLET
ORAL_TABLET | Freq: Three times a day (TID) | ORAL | 0 refills | 20 days | Status: CP | PRN
Start: 2020-08-19 — End: 2020-09-01

## 2020-08-19 MED ORDER — GABAPENTIN 300 MG CAPSULE
ORAL_CAPSULE | Freq: Three times a day (TID) | ORAL | 11 refills | 30.00000 days | Status: CP
Start: 2020-08-19 — End: 2021-08-19

## 2020-08-21 DIAGNOSIS — M5440 Lumbago with sciatica, unspecified side: Principal | ICD-10-CM

## 2020-08-21 DIAGNOSIS — G8929 Other chronic pain: Principal | ICD-10-CM

## 2020-08-21 MED ORDER — GABAPENTIN 600 MG TABLET
ORAL_TABLET | Freq: Three times a day (TID) | ORAL | 11 refills | 30 days | Status: CP
Start: 2020-08-21 — End: 2021-08-21

## 2020-08-22 DIAGNOSIS — J4520 Mild intermittent asthma, uncomplicated: Principal | ICD-10-CM

## 2020-08-22 MED ORDER — ALBUTEROL SULFATE HFA 90 MCG/ACTUATION AEROSOL INHALER
Freq: Four times a day (QID) | RESPIRATORY_TRACT | 2 refills | 0 days | Status: CP | PRN
Start: 2020-08-22 — End: 2021-08-22

## 2020-08-24 ENCOUNTER — Ambulatory Visit: Admit: 2020-08-24 | Discharge: 2020-08-25 | Payer: MEDICAID

## 2020-08-24 DIAGNOSIS — I10 Essential (primary) hypertension: Principal | ICD-10-CM

## 2020-08-24 DIAGNOSIS — E041 Nontoxic single thyroid nodule: Principal | ICD-10-CM

## 2020-08-24 DIAGNOSIS — M5440 Lumbago with sciatica, unspecified side: Principal | ICD-10-CM

## 2020-08-24 DIAGNOSIS — G8929 Other chronic pain: Principal | ICD-10-CM

## 2020-08-24 MED ORDER — LISINOPRIL 5 MG TABLET
ORAL_TABLET | Freq: Every day | ORAL | 11 refills | 30 days | Status: CP
Start: 2020-08-24 — End: 2021-08-24

## 2020-08-25 MED ORDER — KETOROLAC 60 MG/2 ML INTRAMUSCULAR SOLUTION
Freq: Once | INTRAMUSCULAR | 1 refills | 1.00000 days | Status: CP
Start: 2020-08-25 — End: 2020-09-05

## 2020-09-01 ENCOUNTER — Ambulatory Visit
Admit: 2020-09-01 | Discharge: 2020-09-02 | Payer: MEDICAID | Attending: Addiction (Substance Use Disorder) | Primary: Addiction (Substance Use Disorder)

## 2020-09-01 DIAGNOSIS — F1121 Opioid dependence, in remission: Principal | ICD-10-CM

## 2020-09-01 DIAGNOSIS — G8929 Other chronic pain: Principal | ICD-10-CM

## 2020-09-01 DIAGNOSIS — M5440 Lumbago with sciatica, unspecified side: Principal | ICD-10-CM

## 2020-09-01 DIAGNOSIS — Z09 Encounter for follow-up examination after completed treatment for conditions other than malignant neoplasm: Principal | ICD-10-CM

## 2020-09-01 MED ORDER — OMEPRAZOLE 20 MG CAPSULE,DELAYED RELEASE
ORAL_CAPSULE | Freq: Every day | ORAL | 3 refills | 90.00000 days | Status: CP
Start: 2020-09-01 — End: 2021-09-01

## 2020-09-01 MED ORDER — KETOROLAC 60 MG/2 ML INTRAMUSCULAR SOLUTION
Freq: Once | INTRAMUSCULAR | 3 refills | 1.00000 days | Status: CP
Start: 2020-09-01 — End: 2020-09-05

## 2020-09-05 DIAGNOSIS — G8929 Other chronic pain: Principal | ICD-10-CM

## 2020-09-05 DIAGNOSIS — M5440 Lumbago with sciatica, unspecified side: Principal | ICD-10-CM

## 2020-09-05 MED ORDER — KETOROLAC 60 MG/2 ML INTRAMUSCULAR SOLUTION
3 refills | 0.00000 days | Status: CP
Start: 2020-09-05 — End: 2020-09-30

## 2020-09-07 ENCOUNTER — Ambulatory Visit: Admit: 2020-09-07 | Discharge: 2020-09-08 | Payer: MEDICAID

## 2020-09-07 ENCOUNTER — Ambulatory Visit
Admit: 2020-09-07 | Discharge: 2020-09-08 | Payer: MEDICAID | Attending: Orthopaedic Surgery | Primary: Orthopaedic Surgery

## 2020-09-07 DIAGNOSIS — M24542 Contracture, left hand: Principal | ICD-10-CM

## 2020-09-07 DIAGNOSIS — Z981 Arthrodesis status: Principal | ICD-10-CM

## 2020-09-08 ENCOUNTER — Ambulatory Visit
Admit: 2020-09-08 | Discharge: 2020-09-09 | Payer: MEDICAID | Attending: Psychiatric/Mental Health | Primary: Psychiatric/Mental Health

## 2020-09-08 DIAGNOSIS — F4310 Post-traumatic stress disorder, unspecified: Principal | ICD-10-CM

## 2020-09-08 DIAGNOSIS — F419 Anxiety disorder, unspecified: Principal | ICD-10-CM

## 2020-09-08 DIAGNOSIS — F321 Major depressive disorder, single episode, moderate: Principal | ICD-10-CM

## 2020-09-08 DIAGNOSIS — F1121 Opioid dependence, in remission: Principal | ICD-10-CM

## 2020-09-08 MED ORDER — QUETIAPINE 400 MG TABLET
ORAL_TABLET | ORAL | 0 refills | 0.00000 days | Status: CP
Start: 2020-09-08 — End: 2020-09-30

## 2020-09-08 MED ORDER — BUPROPION HCL SR 150 MG TABLET,12 HR SUSTAINED-RELEASE
ORAL_TABLET | Freq: Two times a day (BID) | ORAL | 0 refills | 90.00000 days | Status: CP
Start: 2020-09-08 — End: 2020-12-07

## 2020-09-08 MED ORDER — CLONAZEPAM 1 MG TABLET
ORAL_TABLET | ORAL | 0 refills | 21 days | Status: CP
Start: 2020-09-08 — End: 2020-09-29

## 2020-09-19 ENCOUNTER — Ambulatory Visit: Admit: 2020-09-19 | Discharge: 2020-09-20 | Payer: MEDICAID

## 2020-09-19 DIAGNOSIS — E041 Nontoxic single thyroid nodule: Principal | ICD-10-CM

## 2020-09-27 DIAGNOSIS — E041 Nontoxic single thyroid nodule: Principal | ICD-10-CM

## 2020-09-30 ENCOUNTER — Ambulatory Visit: Admit: 2020-09-30 | Discharge: 2020-10-01 | Payer: MEDICAID

## 2020-09-30 DIAGNOSIS — N182 Chronic kidney disease, stage 2 (mild): Principal | ICD-10-CM

## 2020-09-30 DIAGNOSIS — F411 Generalized anxiety disorder: Principal | ICD-10-CM

## 2020-09-30 DIAGNOSIS — E041 Nontoxic single thyroid nodule: Principal | ICD-10-CM

## 2020-09-30 DIAGNOSIS — M5440 Lumbago with sciatica, unspecified side: Principal | ICD-10-CM

## 2020-09-30 DIAGNOSIS — K760 Fatty (change of) liver, not elsewhere classified: Principal | ICD-10-CM

## 2020-09-30 DIAGNOSIS — I10 Essential (primary) hypertension: Principal | ICD-10-CM

## 2020-09-30 DIAGNOSIS — G8929 Other chronic pain: Principal | ICD-10-CM

## 2020-09-30 MED ORDER — KETOROLAC 60 MG/2 ML INTRAMUSCULAR SOLUTION
3 refills | 0 days | Status: CP
Start: 2020-09-30 — End: ?

## 2020-09-30 MED ORDER — QUETIAPINE 50 MG TABLET
ORAL_TABLET | Freq: Three times a day (TID) | ORAL | 0 refills | 30.00000 days | Status: CP | PRN
Start: 2020-09-30 — End: 2020-10-30

## 2020-10-01 DIAGNOSIS — I10 Essential (primary) hypertension: Principal | ICD-10-CM

## 2020-10-03 DIAGNOSIS — R030 Elevated blood-pressure reading, without diagnosis of hypertension: Principal | ICD-10-CM

## 2020-10-03 MED ORDER — AMLODIPINE 10 MG TABLET
ORAL_TABLET | Freq: Every day | ORAL | 11 refills | 30 days
Start: 2020-10-03 — End: 2021-10-03

## 2020-10-06 DIAGNOSIS — R030 Elevated blood-pressure reading, without diagnosis of hypertension: Principal | ICD-10-CM

## 2020-10-06 DIAGNOSIS — E038 Other specified hypothyroidism: Principal | ICD-10-CM

## 2020-10-06 MED ORDER — AMLODIPINE 10 MG TABLET
ORAL_TABLET | Freq: Every day | ORAL | 7 refills | 30 days | Status: CP
Start: 2020-10-06 — End: 2021-10-06

## 2020-10-06 MED ORDER — LEVOTHYROXINE 200 MCG TABLET
ORAL_TABLET | Freq: Every day | ORAL | 3 refills | 90.00000 days | Status: CP
Start: 2020-10-06 — End: ?

## 2020-10-07 ENCOUNTER — Ambulatory Visit
Admit: 2020-10-07 | Discharge: 2020-10-07 | Payer: MEDICAID | Attending: Physical Medicine & Rehabilitation | Primary: Physical Medicine & Rehabilitation

## 2020-10-07 ENCOUNTER — Ambulatory Visit: Admit: 2020-10-07 | Discharge: 2020-10-07 | Payer: MEDICAID

## 2020-10-07 DIAGNOSIS — M25561 Pain in right knee: Principal | ICD-10-CM

## 2020-10-07 DIAGNOSIS — N182 Chronic kidney disease, stage 2 (mild): Principal | ICD-10-CM

## 2020-10-07 DIAGNOSIS — M25562 Pain in left knee: Principal | ICD-10-CM

## 2020-10-07 DIAGNOSIS — N898 Other specified noninflammatory disorders of vagina: Principal | ICD-10-CM

## 2020-10-07 DIAGNOSIS — I10 Essential (primary) hypertension: Principal | ICD-10-CM

## 2020-10-17 DIAGNOSIS — F1121 Opioid dependence, in remission: Principal | ICD-10-CM

## 2020-10-17 DIAGNOSIS — G8929 Other chronic pain: Principal | ICD-10-CM

## 2020-10-17 DIAGNOSIS — M5440 Lumbago with sciatica, unspecified side: Principal | ICD-10-CM

## 2020-10-17 DIAGNOSIS — N182 Chronic kidney disease, stage 2 (mild): Principal | ICD-10-CM

## 2020-10-20 ENCOUNTER — Telehealth
Admit: 2020-10-20 | Discharge: 2020-10-21 | Payer: MEDICAID | Attending: Psychiatric/Mental Health | Primary: Psychiatric/Mental Health

## 2020-10-20 MED ORDER — RISPERIDONE 0.5 MG TABLET
ORAL_TABLET | 0 refills | 0 days | Status: CP
Start: 2020-10-20 — End: 2020-10-26

## 2020-10-26 ENCOUNTER — Telehealth
Admit: 2020-10-26 | Discharge: 2020-10-27 | Payer: MEDICAID | Attending: Psychiatric/Mental Health | Primary: Psychiatric/Mental Health

## 2020-10-26 DIAGNOSIS — F1121 Opioid dependence, in remission: Principal | ICD-10-CM

## 2020-10-26 DIAGNOSIS — F4310 Post-traumatic stress disorder, unspecified: Principal | ICD-10-CM

## 2020-10-26 MED ORDER — RISPERIDONE 1 MG TABLET
ORAL_TABLET | Freq: Two times a day (BID) | ORAL | 0 refills | 30.00000 days | Status: CP
Start: 2020-10-26 — End: 2020-11-08

## 2020-10-27 ENCOUNTER — Telehealth
Admit: 2020-10-27 | Discharge: 2020-10-28 | Payer: MEDICAID | Attending: Addiction (Substance Use Disorder) | Primary: Addiction (Substance Use Disorder)

## 2020-10-27 DIAGNOSIS — F1121 Opioid dependence, in remission: Principal | ICD-10-CM

## 2020-11-01 DIAGNOSIS — N1831 Stage 3a chronic kidney disease (CMS-HCC): Principal | ICD-10-CM

## 2020-11-02 ENCOUNTER — Ambulatory Visit: Admit: 2020-11-02 | Payer: MEDICAID | Attending: Psychiatric/Mental Health | Primary: Psychiatric/Mental Health

## 2020-11-03 ENCOUNTER — Telehealth
Admit: 2020-11-03 | Discharge: 2020-11-04 | Payer: MEDICAID | Attending: Psychiatric/Mental Health | Primary: Psychiatric/Mental Health

## 2020-11-03 DIAGNOSIS — F1121 Opioid dependence, in remission: Principal | ICD-10-CM

## 2020-11-03 DIAGNOSIS — F4310 Post-traumatic stress disorder, unspecified: Principal | ICD-10-CM

## 2020-11-03 DIAGNOSIS — F419 Anxiety disorder, unspecified: Principal | ICD-10-CM

## 2020-11-08 MED ORDER — RISPERIDONE 1 MG TABLET
ORAL_TABLET | Freq: Two times a day (BID) | ORAL | 0 refills | 90 days | Status: CP
Start: 2020-11-08 — End: 2021-02-06

## 2020-11-09 ENCOUNTER — Ambulatory Visit: Admit: 2020-11-09 | Discharge: 2020-11-10 | Payer: MEDICAID

## 2020-11-09 ENCOUNTER — Ambulatory Visit
Admit: 2020-11-09 | Discharge: 2020-11-10 | Payer: MEDICAID | Attending: Orthopaedic Surgery | Primary: Orthopaedic Surgery

## 2020-11-09 DIAGNOSIS — M24542 Contracture, left hand: Principal | ICD-10-CM

## 2020-11-11 ENCOUNTER — Ambulatory Visit
Admit: 2020-11-11 | Discharge: 2020-11-12 | Payer: MEDICAID | Attending: Physical Medicine & Rehabilitation | Primary: Physical Medicine & Rehabilitation

## 2020-11-11 DIAGNOSIS — G8929 Other chronic pain: Principal | ICD-10-CM

## 2020-11-11 DIAGNOSIS — M25562 Pain in left knee: Principal | ICD-10-CM

## 2020-11-17 ENCOUNTER — Telehealth
Admit: 2020-11-17 | Discharge: 2020-11-18 | Payer: MEDICAID | Attending: Psychiatric/Mental Health | Primary: Psychiatric/Mental Health

## 2020-11-17 DIAGNOSIS — F1121 Opioid dependence, in remission: Principal | ICD-10-CM

## 2020-11-17 DIAGNOSIS — F4310 Post-traumatic stress disorder, unspecified: Principal | ICD-10-CM

## 2020-11-21 ENCOUNTER — Ambulatory Visit: Admit: 2020-11-21 | Discharge: 2020-11-22 | Payer: MEDICAID

## 2020-12-13 ENCOUNTER — Ambulatory Visit: Admit: 2020-12-13 | Discharge: 2020-12-14 | Payer: MEDICAID

## 2020-12-15 ENCOUNTER — Telehealth
Admit: 2020-12-15 | Discharge: 2020-12-16 | Payer: MEDICAID | Attending: Psychiatric/Mental Health | Primary: Psychiatric/Mental Health

## 2020-12-15 DIAGNOSIS — F4310 Post-traumatic stress disorder, unspecified: Principal | ICD-10-CM

## 2020-12-15 DIAGNOSIS — F1121 Opioid dependence, in remission: Principal | ICD-10-CM

## 2020-12-30 ENCOUNTER — Ambulatory Visit: Admit: 2020-12-30 | Discharge: 2020-12-31 | Payer: MEDICAID

## 2020-12-31 DIAGNOSIS — U071 COVID: Principal | ICD-10-CM

## 2021-01-01 ENCOUNTER — Institutional Professional Consult (permissible substitution): Admit: 2021-01-01 | Discharge: 2021-01-02 | Payer: MEDICAID

## 2021-01-03 ENCOUNTER — Ambulatory Visit: Admit: 2021-01-03 | Discharge: 2021-01-04 | Payer: MEDICAID

## 2021-01-13 ENCOUNTER — Ambulatory Visit
Admit: 2021-01-13 | Discharge: 2021-01-14 | Payer: MEDICAID | Attending: Physical Medicine & Rehabilitation | Primary: Physical Medicine & Rehabilitation

## 2021-01-13 DIAGNOSIS — M7918 Myalgia, other site: Principal | ICD-10-CM

## 2021-01-16 ENCOUNTER — Telehealth
Admit: 2021-01-16 | Discharge: 2021-01-17 | Payer: MEDICAID | Attending: Addiction (Substance Use Disorder) | Primary: Addiction (Substance Use Disorder)

## 2021-01-30 ENCOUNTER — Telehealth
Admit: 2021-01-30 | Discharge: 2021-01-31 | Payer: MEDICAID | Attending: Psychiatric/Mental Health | Primary: Psychiatric/Mental Health

## 2021-01-30 DIAGNOSIS — F4310 Post-traumatic stress disorder, unspecified: Principal | ICD-10-CM

## 2021-01-30 DIAGNOSIS — F1121 Opioid dependence, in remission: Principal | ICD-10-CM

## 2021-01-30 MED ORDER — PRAZOSIN 1 MG CAPSULE
ORAL_CAPSULE | 0 refills | 0 days | Status: CP
Start: 2021-01-30 — End: ?

## 2021-02-21 ENCOUNTER — Ambulatory Visit: Admit: 2021-02-21 | Payer: MEDICAID | Attending: Orthopaedic Surgery | Primary: Orthopaedic Surgery

## 2021-02-24 ENCOUNTER — Encounter
Admit: 2021-02-24 | Discharge: 2021-02-25 | Payer: MEDICARE | Attending: Physical Medicine & Rehabilitation | Primary: Physical Medicine & Rehabilitation

## 2021-02-27 MED ORDER — RISPERIDONE 1 MG TABLET
ORAL_TABLET | 2 refills | 0 days | Status: CP
Start: 2021-02-27 — End: ?

## 2021-03-06 ENCOUNTER — Telehealth
Admit: 2021-03-06 | Discharge: 2021-03-07 | Payer: MEDICAID | Attending: Psychiatric/Mental Health | Primary: Psychiatric/Mental Health

## 2021-03-06 DIAGNOSIS — F1121 Opioid dependence, in remission: Principal | ICD-10-CM

## 2021-03-06 DIAGNOSIS — F4310 Post-traumatic stress disorder, unspecified: Principal | ICD-10-CM

## 2021-03-06 MED ORDER — PRAZOSIN 1 MG CAPSULE
ORAL_CAPSULE | Freq: Every evening | ORAL | 0 refills | 90 days | Status: CP
Start: 2021-03-06 — End: 2021-06-04

## 2021-03-06 MED ORDER — RISPERIDONE 1 MG TABLET
ORAL_TABLET | Freq: Every day | ORAL | 0 refills | 90.00000 days | Status: CP
Start: 2021-03-06 — End: 2021-06-04

## 2021-03-06 MED ORDER — BUPROPION HCL SR 150 MG TABLET,12 HR SUSTAINED-RELEASE
ORAL_TABLET | Freq: Two times a day (BID) | ORAL | 0 refills | 90 days | Status: CP
Start: 2021-03-06 — End: 2021-06-04

## 2021-03-16 ENCOUNTER — Ambulatory Visit: Admit: 2021-03-16 | Discharge: 2021-03-17 | Payer: MEDICAID

## 2021-03-16 DIAGNOSIS — D239 Other benign neoplasm of skin, unspecified: Principal | ICD-10-CM

## 2021-04-17 ENCOUNTER — Telehealth
Admit: 2021-04-17 | Discharge: 2021-04-18 | Payer: MEDICAID | Attending: Psychiatric/Mental Health | Primary: Psychiatric/Mental Health

## 2021-04-17 DIAGNOSIS — F1121 Opioid dependence, in remission: Principal | ICD-10-CM

## 2021-04-17 DIAGNOSIS — F4310 Post-traumatic stress disorder, unspecified: Principal | ICD-10-CM

## 2021-05-22 ENCOUNTER — Telehealth
Admit: 2021-05-22 | Discharge: 2021-05-23 | Payer: MEDICAID | Attending: Psychiatric/Mental Health | Primary: Psychiatric/Mental Health

## 2021-05-22 DIAGNOSIS — F1121 Opioid dependence, in remission: Principal | ICD-10-CM

## 2021-05-22 DIAGNOSIS — F4310 Post-traumatic stress disorder, unspecified: Principal | ICD-10-CM

## 2021-05-25 MED ORDER — PRAZOSIN 5 MG CAPSULE
ORAL_CAPSULE | Freq: Every evening | ORAL | 0 refills | 90 days | Status: CP
Start: 2021-05-25 — End: 2021-08-23

## 2021-05-31 ENCOUNTER — Ambulatory Visit
Admit: 2021-05-31 | Discharge: 2021-06-01 | Payer: MEDICAID | Attending: Physical Medicine & Rehabilitation | Primary: Physical Medicine & Rehabilitation

## 2021-05-31 ENCOUNTER — Ambulatory Visit
Admit: 2021-05-31 | Discharge: 2021-06-01 | Payer: MEDICAID | Attending: Student in an Organized Health Care Education/Training Program | Primary: Student in an Organized Health Care Education/Training Program

## 2021-05-31 DIAGNOSIS — M25562 Pain in left knee: Principal | ICD-10-CM

## 2021-05-31 DIAGNOSIS — M5440 Lumbago with sciatica, unspecified side: Principal | ICD-10-CM

## 2021-05-31 DIAGNOSIS — G8929 Other chronic pain: Principal | ICD-10-CM

## 2021-05-31 DIAGNOSIS — E033 Postinfectious hypothyroidism: Principal | ICD-10-CM

## 2021-05-31 DIAGNOSIS — R030 Elevated blood-pressure reading, without diagnosis of hypertension: Principal | ICD-10-CM

## 2021-05-31 MED ORDER — AMLODIPINE 10 MG TABLET
ORAL_TABLET | Freq: Every day | ORAL | 3 refills | 90 days | Status: CP
Start: 2021-05-31 — End: 2022-05-31

## 2021-05-31 MED ORDER — OMEPRAZOLE 20 MG CAPSULE,DELAYED RELEASE
ORAL_CAPSULE | Freq: Every day | ORAL | 3 refills | 90 days | Status: CP
Start: 2021-05-31 — End: 2022-05-31

## 2021-05-31 MED ORDER — GABAPENTIN 600 MG TABLET
ORAL_TABLET | Freq: Three times a day (TID) | ORAL | 11 refills | 30.00000 days | Status: CP
Start: 2021-05-31 — End: 2022-05-31

## 2021-06-01 DIAGNOSIS — E033 Postinfectious hypothyroidism: Principal | ICD-10-CM

## 2021-06-01 MED ORDER — LEVOTHYROXINE 150 MCG TABLET
ORAL_TABLET | Freq: Every day | ORAL | 11 refills | 30 days | Status: CP
Start: 2021-06-01 — End: 2022-06-01

## 2021-06-05 MED ORDER — LEVOTHYROXINE 150 MCG TABLET
ORAL_TABLET | Freq: Every day | ORAL | 11 refills | 30 days | Status: CP
Start: 2021-06-05 — End: 2022-06-05

## 2021-06-13 MED ORDER — BUPROPION HCL SR 150 MG TABLET,12 HR SUSTAINED-RELEASE
ORAL_TABLET | 2 refills | 0 days | Status: CP
Start: 2021-06-13 — End: 2021-09-11

## 2021-06-19 ENCOUNTER — Telehealth
Admit: 2021-06-19 | Discharge: 2021-06-20 | Payer: MEDICAID | Attending: Psychiatric/Mental Health | Primary: Psychiatric/Mental Health

## 2021-06-19 DIAGNOSIS — F4310 Post-traumatic stress disorder, unspecified: Principal | ICD-10-CM

## 2021-06-19 DIAGNOSIS — F1121 Opioid dependence, in remission: Principal | ICD-10-CM

## 2021-06-19 MED ORDER — RISPERIDONE 1 MG TABLET
ORAL_TABLET | Freq: Every day | ORAL | 1 refills | 90.00000 days | Status: CP
Start: 2021-06-19 — End: 2021-12-16

## 2021-06-19 MED ORDER — BUPROPION HCL SR 150 MG TABLET,12 HR SUSTAINED-RELEASE
ORAL_TABLET | Freq: Two times a day (BID) | ORAL | 1 refills | 90 days | Status: CP
Start: 2021-06-19 — End: 2021-12-16

## 2021-06-19 MED ORDER — PRAZOSIN 5 MG CAPSULE
ORAL_CAPSULE | Freq: Every evening | ORAL | 1 refills | 90 days | Status: CP
Start: 2021-06-19 — End: 2021-12-16

## 2021-06-26 ENCOUNTER — Telehealth
Admit: 2021-06-26 | Discharge: 2021-06-27 | Payer: MEDICAID | Attending: Addiction (Substance Use Disorder) | Primary: Addiction (Substance Use Disorder)

## 2021-06-26 DIAGNOSIS — F1190 Opioid use, unspecified, uncomplicated: Principal | ICD-10-CM

## 2021-06-26 DIAGNOSIS — F4310 Post-traumatic stress disorder, unspecified: Principal | ICD-10-CM

## 2021-06-26 DIAGNOSIS — F411 Generalized anxiety disorder: Principal | ICD-10-CM

## 2021-07-11 MED ORDER — BD NANO 2ND GEN PEN NEEDLE 32 GAUGE X 5/32" (4 MM)
1 refills | 0 days
Start: 2021-07-11 — End: ?

## 2021-07-12 ENCOUNTER — Telehealth
Admit: 2021-07-12 | Discharge: 2021-07-13 | Payer: MEDICAID | Attending: Addiction (Substance Use Disorder) | Primary: Addiction (Substance Use Disorder)

## 2021-07-12 DIAGNOSIS — F4310 Post-traumatic stress disorder, unspecified: Principal | ICD-10-CM

## 2021-07-12 MED ORDER — BD NANO 2ND GEN PEN NEEDLE 32 GAUGE X 5/32" (4 MM)
1 refills | 0 days
Start: 2021-07-12 — End: ?

## 2021-07-20 ENCOUNTER — Telehealth
Admit: 2021-07-20 | Discharge: 2021-07-21 | Payer: MEDICARE | Attending: Addiction (Substance Use Disorder) | Primary: Addiction (Substance Use Disorder)

## 2021-07-20 DIAGNOSIS — F4310 Post-traumatic stress disorder, unspecified: Principal | ICD-10-CM

## 2021-07-21 ENCOUNTER — Ambulatory Visit
Admit: 2021-07-21 | Discharge: 2021-07-22 | Payer: MEDICARE | Attending: Orthopaedic Surgery | Primary: Orthopaedic Surgery

## 2021-07-21 ENCOUNTER — Ambulatory Visit: Admit: 2021-07-21 | Discharge: 2021-07-22 | Payer: MEDICARE

## 2021-07-21 DIAGNOSIS — Z981 Arthrodesis status: Principal | ICD-10-CM

## 2021-07-27 ENCOUNTER — Telehealth
Admit: 2021-07-27 | Discharge: 2021-07-28 | Payer: MEDICARE | Attending: Addiction (Substance Use Disorder) | Primary: Addiction (Substance Use Disorder)

## 2021-07-27 DIAGNOSIS — F4310 Post-traumatic stress disorder, unspecified: Principal | ICD-10-CM

## 2021-08-01 ENCOUNTER — Telehealth
Admit: 2021-08-01 | Discharge: 2021-08-02 | Payer: MEDICAID | Attending: Addiction (Substance Use Disorder) | Primary: Addiction (Substance Use Disorder)

## 2021-08-01 DIAGNOSIS — F411 Generalized anxiety disorder: Principal | ICD-10-CM

## 2021-08-01 DIAGNOSIS — F4310 Post-traumatic stress disorder, unspecified: Principal | ICD-10-CM

## 2021-08-02 ENCOUNTER — Ambulatory Visit: Admit: 2021-08-02 | Discharge: 2021-08-03 | Payer: MEDICAID

## 2021-08-02 DIAGNOSIS — Z981 Arthrodesis status: Principal | ICD-10-CM

## 2021-08-02 DIAGNOSIS — E033 Postinfectious hypothyroidism: Principal | ICD-10-CM

## 2021-08-07 ENCOUNTER — Telehealth
Admit: 2021-08-07 | Discharge: 2021-08-08 | Payer: MEDICAID | Attending: Addiction (Substance Use Disorder) | Primary: Addiction (Substance Use Disorder)

## 2021-08-07 DIAGNOSIS — F411 Generalized anxiety disorder: Principal | ICD-10-CM

## 2021-08-07 DIAGNOSIS — F4310 Post-traumatic stress disorder, unspecified: Principal | ICD-10-CM

## 2021-08-18 MED ORDER — ACETAMINOPHEN 500 MG CAPSULE
ORAL_CAPSULE | Freq: Three times a day (TID) | ORAL | 0 refills | 0 days | Status: CP
Start: 2021-08-18 — End: ?
  Filled 2021-08-21: qty 42, 7d supply, fill #0

## 2021-08-18 MED ORDER — OXYCODONE 5 MG TABLET
ORAL_TABLET | ORAL | 0 refills | 3 days | Status: CP | PRN
Start: 2021-08-18 — End: ?

## 2021-08-18 MED ORDER — IBUPROFEN 600 MG TABLET
ORAL_TABLET | Freq: Four times a day (QID) | ORAL | 0 refills | 10 days | Status: CP | PRN
Start: 2021-08-18 — End: 2021-08-28

## 2021-08-21 ENCOUNTER — Ambulatory Visit
Admit: 2021-08-21 | Discharge: 2021-08-22 | Payer: MEDICAID | Attending: Orthopaedic Surgery | Primary: Orthopaedic Surgery

## 2021-08-21 MED FILL — IBUPROFEN 600 MG TABLET: ORAL | 10 days supply | Qty: 40 | Fill #0

## 2021-08-21 MED FILL — OXYCODONE 5 MG TABLET: ORAL | 3 days supply | Qty: 15 | Fill #0

## 2021-08-25 ENCOUNTER — Ambulatory Visit
Admit: 2021-08-25 | Discharge: 2021-08-26 | Payer: MEDICAID | Attending: Student in an Organized Health Care Education/Training Program | Primary: Student in an Organized Health Care Education/Training Program

## 2021-08-25 DIAGNOSIS — E041 Nontoxic single thyroid nodule: Principal | ICD-10-CM

## 2021-08-25 MED ORDER — LEVOTHYROXINE 150 MCG TABLET
ORAL_TABLET | Freq: Every day | ORAL | 2 refills | 90 days | Status: CP
Start: 2021-08-25 — End: 2022-05-22

## 2021-08-30 ENCOUNTER — Telehealth
Admit: 2021-08-30 | Discharge: 2021-08-31 | Payer: MEDICAID | Attending: Addiction (Substance Use Disorder) | Primary: Addiction (Substance Use Disorder)

## 2021-08-30 ENCOUNTER — Ambulatory Visit
Admit: 2021-08-30 | Discharge: 2021-08-31 | Payer: MEDICAID | Attending: Physical Medicine & Rehabilitation | Primary: Physical Medicine & Rehabilitation

## 2021-08-30 DIAGNOSIS — F4310 Post-traumatic stress disorder, unspecified: Principal | ICD-10-CM

## 2021-09-01 ENCOUNTER — Ambulatory Visit: Admit: 2021-09-01 | Discharge: 2021-09-01 | Payer: MEDICAID

## 2021-09-01 ENCOUNTER — Ambulatory Visit
Admit: 2021-09-01 | Discharge: 2021-09-01 | Payer: MEDICAID | Attending: Orthopaedic Surgery | Primary: Orthopaedic Surgery

## 2021-09-05 MED ORDER — IBUPROFEN 600 MG TABLET
ORAL_TABLET | Freq: Four times a day (QID) | ORAL | 0 refills | 10 days | PRN
Start: 2021-09-05 — End: 2021-09-15

## 2021-09-22 ENCOUNTER — Ambulatory Visit: Admit: 2021-09-22 | Discharge: 2021-09-23 | Payer: MEDICAID

## 2021-09-22 ENCOUNTER — Ambulatory Visit
Admit: 2021-09-22 | Payer: MEDICAID | Attending: Rehabilitative and Restorative Service Providers" | Primary: Rehabilitative and Restorative Service Providers"

## 2021-09-22 ENCOUNTER — Ambulatory Visit
Admit: 2021-09-22 | Discharge: 2021-09-23 | Payer: MEDICAID | Attending: Orthopaedic Surgery | Primary: Orthopaedic Surgery

## 2021-09-22 ENCOUNTER — Ambulatory Visit
Admit: 2021-09-22 | Discharge: 2021-10-21 | Payer: MEDICAID | Attending: Rehabilitative and Restorative Service Providers" | Primary: Rehabilitative and Restorative Service Providers"

## 2021-09-27 ENCOUNTER — Telehealth
Admit: 2021-09-27 | Discharge: 2021-09-28 | Payer: MEDICAID | Attending: Addiction (Substance Use Disorder) | Primary: Addiction (Substance Use Disorder)

## 2021-09-27 DIAGNOSIS — F4310 Post-traumatic stress disorder, unspecified: Principal | ICD-10-CM

## 2021-10-04 ENCOUNTER — Ambulatory Visit
Admit: 2021-10-04 | Discharge: 2021-10-05 | Payer: MEDICAID | Attending: Physical Medicine & Rehabilitation | Primary: Physical Medicine & Rehabilitation

## 2021-10-04 DIAGNOSIS — M25561 Pain in right knee: Principal | ICD-10-CM

## 2021-10-04 DIAGNOSIS — G8929 Other chronic pain: Principal | ICD-10-CM

## 2021-10-04 DIAGNOSIS — M25562 Pain in left knee: Principal | ICD-10-CM

## 2021-10-10 ENCOUNTER — Telehealth
Admit: 2021-10-10 | Discharge: 2021-10-11 | Payer: MEDICAID | Attending: Addiction (Substance Use Disorder) | Primary: Addiction (Substance Use Disorder)

## 2021-10-10 DIAGNOSIS — F4310 Post-traumatic stress disorder, unspecified: Principal | ICD-10-CM

## 2021-10-13 ENCOUNTER — Ambulatory Visit: Admit: 2021-10-13 | Discharge: 2021-10-14 | Payer: MEDICAID

## 2021-10-13 ENCOUNTER — Ambulatory Visit
Admit: 2021-10-13 | Discharge: 2021-10-14 | Payer: MEDICAID | Attending: Orthopaedic Surgery | Primary: Orthopaedic Surgery

## 2021-10-18 ENCOUNTER — Telehealth
Admit: 2021-10-18 | Discharge: 2021-10-19 | Payer: MEDICAID | Attending: Addiction (Substance Use Disorder) | Primary: Addiction (Substance Use Disorder)

## 2021-10-18 DIAGNOSIS — F4310 Post-traumatic stress disorder, unspecified: Principal | ICD-10-CM

## 2021-10-25 ENCOUNTER — Telehealth
Admit: 2021-10-25 | Discharge: 2021-10-26 | Payer: MEDICAID | Attending: Addiction (Substance Use Disorder) | Primary: Addiction (Substance Use Disorder)

## 2021-10-25 DIAGNOSIS — F4310 Post-traumatic stress disorder, unspecified: Principal | ICD-10-CM

## 2021-10-27 ENCOUNTER — Ambulatory Visit
Admit: 2021-10-27 | Discharge: 2021-11-20 | Payer: MEDICAID | Attending: Rehabilitative and Restorative Service Providers" | Primary: Rehabilitative and Restorative Service Providers"

## 2021-10-27 ENCOUNTER — Ambulatory Visit
Admit: 2021-10-27 | Payer: MEDICAID | Attending: Rehabilitative and Restorative Service Providers" | Primary: Rehabilitative and Restorative Service Providers"

## 2021-11-03 ENCOUNTER — Ambulatory Visit
Admit: 2021-11-03 | Discharge: 2021-11-04 | Payer: MEDICAID | Attending: Addiction (Substance Use Disorder) | Primary: Addiction (Substance Use Disorder)

## 2021-11-03 ENCOUNTER — Telehealth
Admit: 2021-11-03 | Discharge: 2021-11-04 | Payer: MEDICAID | Attending: Psychiatric/Mental Health | Primary: Psychiatric/Mental Health

## 2021-11-03 DIAGNOSIS — F4310 Post-traumatic stress disorder, unspecified: Principal | ICD-10-CM

## 2021-11-03 DIAGNOSIS — F411 Generalized anxiety disorder: Principal | ICD-10-CM

## 2021-11-03 MED ORDER — RISPERIDONE 2 MG TABLET
ORAL_TABLET | Freq: Every day | ORAL | 0 refills | 90.00000 days | Status: CP
Start: 2021-11-03 — End: 2022-02-01

## 2021-11-03 MED ORDER — BUPROPION HCL SR 150 MG TABLET,12 HR SUSTAINED-RELEASE
ORAL_TABLET | Freq: Two times a day (BID) | ORAL | 0 refills | 90 days | Status: CP
Start: 2021-11-03 — End: 2022-02-01

## 2021-11-03 MED ORDER — PRAZOSIN 5 MG CAPSULE
ORAL_CAPSULE | Freq: Every evening | ORAL | 0 refills | 90 days | Status: CP
Start: 2021-11-03 — End: 2022-02-01

## 2021-11-07 ENCOUNTER — Telehealth
Admit: 2021-11-07 | Discharge: 2021-11-08 | Payer: MEDICAID | Attending: Addiction (Substance Use Disorder) | Primary: Addiction (Substance Use Disorder)

## 2021-11-07 DIAGNOSIS — F4310 Post-traumatic stress disorder, unspecified: Principal | ICD-10-CM

## 2021-11-22 ENCOUNTER — Telehealth
Admit: 2021-11-22 | Discharge: 2021-11-23 | Payer: MEDICAID | Attending: Addiction (Substance Use Disorder) | Primary: Addiction (Substance Use Disorder)

## 2021-11-22 DIAGNOSIS — F4310 Post-traumatic stress disorder, unspecified: Principal | ICD-10-CM

## 2021-12-02 DIAGNOSIS — J4520 Mild intermittent asthma, uncomplicated: Principal | ICD-10-CM

## 2021-12-02 MED ORDER — ALBUTEROL SULFATE HFA 90 MCG/ACTUATION AEROSOL INHALER
Freq: Four times a day (QID) | RESPIRATORY_TRACT | 2 refills | 0 days | PRN
Start: 2021-12-02 — End: 2022-12-02

## 2021-12-04 MED ORDER — ALBUTEROL SULFATE HFA 90 MCG/ACTUATION AEROSOL INHALER
Freq: Four times a day (QID) | RESPIRATORY_TRACT | 2 refills | 0 days | Status: CP | PRN
Start: 2021-12-04 — End: 2022-12-04

## 2021-12-12 ENCOUNTER — Telehealth
Admit: 2021-12-12 | Discharge: 2021-12-13 | Payer: MEDICAID | Attending: Psychiatric/Mental Health | Primary: Psychiatric/Mental Health

## 2021-12-13 ENCOUNTER — Ambulatory Visit
Admit: 2021-12-13 | Payer: MEDICAID | Attending: Addiction (Substance Use Disorder) | Primary: Addiction (Substance Use Disorder)

## 2021-12-20 ENCOUNTER — Telehealth
Admit: 2021-12-20 | Discharge: 2021-12-21 | Payer: MEDICAID | Attending: Addiction (Substance Use Disorder) | Primary: Addiction (Substance Use Disorder)

## 2021-12-20 DIAGNOSIS — F4310 Post-traumatic stress disorder, unspecified: Principal | ICD-10-CM

## 2022-01-08 ENCOUNTER — Telehealth
Admit: 2022-01-08 | Discharge: 2022-01-09 | Payer: MEDICAID | Attending: Addiction (Substance Use Disorder) | Primary: Addiction (Substance Use Disorder)

## 2022-01-08 DIAGNOSIS — F4310 Post-traumatic stress disorder, unspecified: Principal | ICD-10-CM

## 2022-01-17 ENCOUNTER — Telehealth
Admit: 2022-01-17 | Discharge: 2022-01-18 | Payer: MEDICAID | Attending: Addiction (Substance Use Disorder) | Primary: Addiction (Substance Use Disorder)

## 2022-01-17 DIAGNOSIS — F4310 Post-traumatic stress disorder, unspecified: Principal | ICD-10-CM

## 2022-01-25 ENCOUNTER — Telehealth
Admit: 2022-01-25 | Discharge: 2022-01-26 | Payer: MEDICAID | Attending: Addiction (Substance Use Disorder) | Primary: Addiction (Substance Use Disorder)

## 2022-01-25 DIAGNOSIS — F411 Generalized anxiety disorder: Principal | ICD-10-CM

## 2022-01-31 ENCOUNTER — Telehealth
Admit: 2022-01-31 | Discharge: 2022-02-01 | Payer: MEDICAID | Attending: Addiction (Substance Use Disorder) | Primary: Addiction (Substance Use Disorder)

## 2022-01-31 DIAGNOSIS — F4310 Post-traumatic stress disorder, unspecified: Principal | ICD-10-CM

## 2022-02-02 ENCOUNTER — Ambulatory Visit: Admit: 2022-02-02 | Payer: MEDICAID | Attending: Psychiatric/Mental Health | Primary: Psychiatric/Mental Health

## 2022-02-05 ENCOUNTER — Ambulatory Visit
Admit: 2022-02-05 | Payer: MEDICAID | Attending: Addiction (Substance Use Disorder) | Primary: Addiction (Substance Use Disorder)

## 2022-02-07 ENCOUNTER — Telehealth
Admit: 2022-02-07 | Discharge: 2022-02-08 | Payer: MEDICAID | Attending: Psychiatric/Mental Health | Primary: Psychiatric/Mental Health

## 2022-02-07 DIAGNOSIS — F1190 Opioid use, unspecified, uncomplicated: Principal | ICD-10-CM

## 2022-02-07 DIAGNOSIS — F411 Generalized anxiety disorder: Principal | ICD-10-CM

## 2022-02-07 DIAGNOSIS — F4310 Post-traumatic stress disorder, unspecified: Principal | ICD-10-CM

## 2022-02-07 MED ORDER — PRAZOSIN 5 MG CAPSULE
ORAL_CAPSULE | Freq: Every evening | ORAL | 0 refills | 90 days | Status: CP
Start: 2022-02-07 — End: 2022-05-08

## 2022-02-07 MED ORDER — BUPROPION HCL SR 150 MG TABLET,12 HR SUSTAINED-RELEASE
ORAL_TABLET | Freq: Two times a day (BID) | ORAL | 0 refills | 90 days | Status: CP
Start: 2022-02-07 — End: 2022-05-08

## 2022-02-07 MED ORDER — RISPERIDONE 2 MG TABLET
ORAL_TABLET | Freq: Every day | ORAL | 0 refills | 90 days | Status: CP
Start: 2022-02-07 — End: 2022-05-08

## 2022-02-09 ENCOUNTER — Institutional Professional Consult (permissible substitution)
Admit: 2022-02-09 | Discharge: 2022-02-10 | Payer: MEDICAID | Attending: Addiction (Substance Use Disorder) | Primary: Addiction (Substance Use Disorder)

## 2022-02-09 DIAGNOSIS — F4310 Post-traumatic stress disorder, unspecified: Principal | ICD-10-CM

## 2022-02-16 ENCOUNTER — Telehealth
Admit: 2022-02-16 | Discharge: 2022-02-17 | Payer: MEDICAID | Attending: Addiction (Substance Use Disorder) | Primary: Addiction (Substance Use Disorder)

## 2022-02-16 DIAGNOSIS — F4310 Post-traumatic stress disorder, unspecified: Principal | ICD-10-CM

## 2022-02-21 DIAGNOSIS — K219 Gastro-esophageal reflux disease without esophagitis: Principal | ICD-10-CM

## 2022-02-21 MED ORDER — OMEPRAZOLE 20 MG CAPSULE,DELAYED RELEASE
ORAL_CAPSULE | Freq: Every day | ORAL | 3 refills | 90 days
Start: 2022-02-21 — End: 2023-02-21

## 2022-02-24 MED ORDER — OMEPRAZOLE 20 MG CAPSULE,DELAYED RELEASE
ORAL_CAPSULE | Freq: Every day | ORAL | 3 refills | 90 days
Start: 2022-02-24 — End: 2023-02-24

## 2022-03-14 ENCOUNTER — Telehealth
Admit: 2022-03-14 | Discharge: 2022-03-15 | Payer: MEDICAID | Attending: Psychiatric/Mental Health | Primary: Psychiatric/Mental Health

## 2022-03-14 DIAGNOSIS — F4310 Post-traumatic stress disorder, unspecified: Principal | ICD-10-CM

## 2022-03-14 DIAGNOSIS — F411 Generalized anxiety disorder: Principal | ICD-10-CM

## 2022-03-15 ENCOUNTER — Telehealth
Admit: 2022-03-15 | Discharge: 2022-03-16 | Payer: MEDICAID | Attending: Marriage & Family Therapist | Primary: Marriage & Family Therapist

## 2022-03-15 DIAGNOSIS — F1190 Opioid use, unspecified, uncomplicated: Principal | ICD-10-CM

## 2022-03-15 DIAGNOSIS — F4323 Adjustment disorder with mixed anxiety and depressed mood: Principal | ICD-10-CM

## 2022-03-15 DIAGNOSIS — F4310 Post-traumatic stress disorder, unspecified: Principal | ICD-10-CM

## 2022-04-05 ENCOUNTER — Telehealth
Admit: 2022-04-05 | Discharge: 2022-04-06 | Payer: MEDICAID | Attending: Marriage & Family Therapist | Primary: Marriage & Family Therapist

## 2022-04-24 ENCOUNTER — Ambulatory Visit: Admit: 2022-04-24 | Payer: MEDICAID | Attending: Marriage & Family Therapist | Primary: Marriage & Family Therapist

## 2022-04-29 MED ORDER — RISPERIDONE 2 MG TABLET
ORAL_TABLET | Freq: Every day | ORAL | 0 refills | 0 days
Start: 2022-04-29 — End: ?

## 2022-04-30 ENCOUNTER — Telehealth
Admit: 2022-04-30 | Discharge: 2022-05-01 | Payer: MEDICAID | Attending: Marriage & Family Therapist | Primary: Marriage & Family Therapist

## 2022-04-30 ENCOUNTER — Telehealth
Admit: 2022-04-30 | Discharge: 2022-05-01 | Payer: MEDICAID | Attending: Psychiatric/Mental Health | Primary: Psychiatric/Mental Health

## 2022-04-30 DIAGNOSIS — F321 Major depressive disorder, single episode, moderate: Principal | ICD-10-CM

## 2022-04-30 DIAGNOSIS — F1190 Opioid use, unspecified, uncomplicated: Principal | ICD-10-CM

## 2022-04-30 DIAGNOSIS — F4310 Post-traumatic stress disorder, unspecified: Principal | ICD-10-CM

## 2022-04-30 MED ORDER — PRAZOSIN 5 MG CAPSULE
ORAL_CAPSULE | Freq: Every evening | ORAL | 0 refills | 90 days | Status: CP
Start: 2022-04-30 — End: 2022-07-29

## 2022-04-30 MED ORDER — BUPROPION HCL SR 150 MG TABLET,12 HR SUSTAINED-RELEASE
ORAL_TABLET | Freq: Two times a day (BID) | ORAL | 0 refills | 90 days | Status: CP
Start: 2022-04-30 — End: 2022-07-29

## 2022-04-30 MED ORDER — RISPERIDONE 2 MG TABLET
ORAL_TABLET | Freq: Every day | ORAL | 0 refills | 90.00000 days | Status: CP
Start: 2022-04-30 — End: 2022-07-29

## 2022-05-10 ENCOUNTER — Telehealth
Admit: 2022-05-10 | Discharge: 2022-05-11 | Payer: MEDICAID | Attending: Marriage & Family Therapist | Primary: Marriage & Family Therapist

## 2022-05-25 ENCOUNTER — Telehealth
Admit: 2022-05-25 | Discharge: 2022-05-26 | Payer: MEDICAID | Attending: Marriage & Family Therapist | Primary: Marriage & Family Therapist

## 2022-05-31 ENCOUNTER — Telehealth
Admit: 2022-05-31 | Discharge: 2022-06-01 | Payer: MEDICAID | Attending: Marriage & Family Therapist | Primary: Marriage & Family Therapist

## 2022-05-31 DIAGNOSIS — G8929 Other chronic pain: Principal | ICD-10-CM

## 2022-05-31 DIAGNOSIS — F99 Mental disorder, not otherwise specified: Principal | ICD-10-CM

## 2022-05-31 DIAGNOSIS — F5105 Insomnia due to other mental disorder: Principal | ICD-10-CM

## 2022-05-31 DIAGNOSIS — F4310 Post-traumatic stress disorder, unspecified: Principal | ICD-10-CM

## 2022-05-31 DIAGNOSIS — M5440 Lumbago with sciatica, unspecified side: Principal | ICD-10-CM

## 2022-05-31 MED ORDER — GABAPENTIN 600 MG TABLET
ORAL_TABLET | 11 refills | 0 days
Start: 2022-05-31 — End: ?

## 2022-06-01 DIAGNOSIS — G8929 Other chronic pain: Principal | ICD-10-CM

## 2022-06-01 DIAGNOSIS — M5440 Lumbago with sciatica, unspecified side: Principal | ICD-10-CM

## 2022-06-01 MED ORDER — GABAPENTIN 600 MG TABLET
ORAL_TABLET | Freq: Three times a day (TID) | ORAL | 0 refills | 30 days | Status: CP
Start: 2022-06-01 — End: 2023-06-01

## 2022-06-04 ENCOUNTER — Telehealth
Admit: 2022-06-04 | Discharge: 2022-06-05 | Payer: MEDICAID | Attending: Psychiatric/Mental Health | Primary: Psychiatric/Mental Health

## 2022-06-04 DIAGNOSIS — F4310 Post-traumatic stress disorder, unspecified: Principal | ICD-10-CM

## 2022-06-07 ENCOUNTER — Telehealth
Admit: 2022-06-07 | Discharge: 2022-06-08 | Payer: MEDICAID | Attending: Marriage & Family Therapist | Primary: Marriage & Family Therapist

## 2022-06-07 DIAGNOSIS — F4310 Post-traumatic stress disorder, unspecified: Principal | ICD-10-CM

## 2022-06-07 DIAGNOSIS — F1190 Opioid use, unspecified, uncomplicated: Principal | ICD-10-CM

## 2022-06-14 ENCOUNTER — Telehealth
Admit: 2022-06-14 | Discharge: 2022-06-15 | Payer: MEDICAID | Attending: Marriage & Family Therapist | Primary: Marriage & Family Therapist

## 2022-06-21 ENCOUNTER — Telehealth
Admit: 2022-06-21 | Discharge: 2022-06-22 | Payer: MEDICAID | Attending: Marriage & Family Therapist | Primary: Marriage & Family Therapist

## 2022-07-03 MED ORDER — RISPERIDONE 2 MG TABLET
ORAL_TABLET | Freq: Every day | ORAL | 0 refills | 90 days | Status: CP
Start: 2022-07-03 — End: ?

## 2022-07-05 ENCOUNTER — Telehealth
Admit: 2022-07-05 | Discharge: 2022-07-06 | Payer: MEDICAID | Attending: Marriage & Family Therapist | Primary: Marriage & Family Therapist

## 2022-07-05 DIAGNOSIS — F1190 Opioid use, unspecified, uncomplicated: Principal | ICD-10-CM

## 2022-07-05 DIAGNOSIS — F4310 Post-traumatic stress disorder, unspecified: Principal | ICD-10-CM

## 2022-07-12 ENCOUNTER — Ambulatory Visit: Admit: 2022-07-12 | Payer: MEDICAID | Attending: Marriage & Family Therapist | Primary: Marriage & Family Therapist

## 2022-07-13 ENCOUNTER — Telehealth
Admit: 2022-07-13 | Discharge: 2022-07-14 | Payer: MEDICAID | Attending: Marriage & Family Therapist | Primary: Marriage & Family Therapist

## 2022-07-13 DIAGNOSIS — F4310 Post-traumatic stress disorder, unspecified: Principal | ICD-10-CM

## 2022-07-13 DIAGNOSIS — F1190 Opioid use, unspecified, uncomplicated: Principal | ICD-10-CM

## 2022-07-13 DIAGNOSIS — M5440 Lumbago with sciatica, unspecified side: Principal | ICD-10-CM

## 2022-07-13 DIAGNOSIS — G8929 Other chronic pain: Principal | ICD-10-CM

## 2022-07-13 MED ORDER — LEVOTHYROXINE 150 MCG TABLET
ORAL_TABLET | Freq: Every day | ORAL | 2 refills | 90 days
Start: 2022-07-13 — End: 2023-04-09

## 2022-07-13 MED ORDER — OMEPRAZOLE 20 MG CAPSULE,DELAYED RELEASE
ORAL_CAPSULE | 0 refills | 0 days
Start: 2022-07-13 — End: ?

## 2022-07-13 MED ORDER — GABAPENTIN 600 MG TABLET
ORAL_TABLET | Freq: Three times a day (TID) | ORAL | 0 refills | 30 days
Start: 2022-07-13 — End: 2023-07-13

## 2022-07-16 ENCOUNTER — Ambulatory Visit: Admit: 2022-07-16 | Payer: MEDICAID | Attending: Psychiatric/Mental Health | Primary: Psychiatric/Mental Health

## 2022-07-19 MED ORDER — LEVOTHYROXINE 150 MCG TABLET
ORAL_TABLET | Freq: Every day | ORAL | 0 refills | 30 days | Status: CP
Start: 2022-07-19 — End: 2023-04-15

## 2022-08-02 ENCOUNTER — Ambulatory Visit: Admit: 2022-08-02 | Discharge: 2022-08-03 | Payer: MEDICAID

## 2022-08-02 MED ORDER — AMLODIPINE 10 MG TABLET
ORAL_TABLET | Freq: Every day | ORAL | 3 refills | 90 days | Status: CP
Start: 2022-08-02 — End: 2023-08-02

## 2022-08-02 MED ORDER — GABAPENTIN 600 MG TABLET
ORAL_TABLET | Freq: Three times a day (TID) | ORAL | 0 refills | 30 days | Status: CP
Start: 2022-08-02 — End: 2023-08-02

## 2022-08-02 MED ORDER — LEVOTHYROXINE 150 MCG TABLET
ORAL_TABLET | Freq: Every day | ORAL | 0 refills | 30 days | Status: CP
Start: 2022-08-02 — End: 2023-04-29

## 2022-08-03 ENCOUNTER — Ambulatory Visit
Admit: 2022-08-03 | Discharge: 2022-08-04 | Payer: MEDICAID | Attending: Physical Medicine & Rehabilitation | Primary: Physical Medicine & Rehabilitation

## 2022-08-03 DIAGNOSIS — M25562 Pain in left knee: Principal | ICD-10-CM

## 2022-08-03 DIAGNOSIS — M1710 Unilateral primary osteoarthritis, unspecified knee: Principal | ICD-10-CM

## 2022-08-03 DIAGNOSIS — M25561 Pain in right knee: Principal | ICD-10-CM

## 2022-08-03 DIAGNOSIS — G8929 Other chronic pain: Principal | ICD-10-CM

## 2022-08-05 MED ORDER — LEVOTHYROXINE 150 MCG TABLET
ORAL_TABLET | Freq: Every day | ORAL | 3 refills | 90.00000 days | Status: CP
Start: 2022-08-05 — End: 2023-08-05

## 2022-08-05 MED ORDER — ALBUTEROL SULFATE HFA 90 MCG/ACTUATION AEROSOL INHALER
Freq: Four times a day (QID) | RESPIRATORY_TRACT | 2 refills | 0 days | Status: CP | PRN
Start: 2022-08-05 — End: 2023-08-05

## 2022-08-09 DIAGNOSIS — E041 Nontoxic single thyroid nodule: Principal | ICD-10-CM

## 2022-08-09 DIAGNOSIS — E033 Postinfectious hypothyroidism: Principal | ICD-10-CM

## 2022-08-10 ENCOUNTER — Telehealth
Admit: 2022-08-10 | Discharge: 2022-08-11 | Payer: MEDICAID | Attending: Marriage & Family Therapist | Primary: Marriage & Family Therapist

## 2022-08-10 DIAGNOSIS — F4310 Post-traumatic stress disorder, unspecified: Principal | ICD-10-CM

## 2022-08-10 DIAGNOSIS — F1190 Opioid use, unspecified, uncomplicated: Principal | ICD-10-CM

## 2022-08-21 ENCOUNTER — Telehealth
Admit: 2022-08-21 | Discharge: 2022-08-22 | Payer: MEDICAID | Attending: Psychiatric/Mental Health | Primary: Psychiatric/Mental Health

## 2022-08-21 DIAGNOSIS — F4310 Post-traumatic stress disorder, unspecified: Principal | ICD-10-CM

## 2022-08-21 MED ORDER — PRAZOSIN 5 MG CAPSULE
ORAL_CAPSULE | Freq: Every evening | ORAL | 0 refills | 90 days | Status: CP
Start: 2022-08-21 — End: 2022-11-19

## 2022-08-21 MED ORDER — RISPERIDONE 2 MG TABLET
ORAL_TABLET | Freq: Every day | ORAL | 0 refills | 90 days | Status: CP
Start: 2022-08-21 — End: ?

## 2022-08-21 MED ORDER — BUPROPION HCL SR 150 MG TABLET,12 HR SUSTAINED-RELEASE
ORAL_TABLET | Freq: Two times a day (BID) | ORAL | 0 refills | 90 days | Status: CP
Start: 2022-08-21 — End: 2022-11-19

## 2022-08-22 MED ORDER — OMEPRAZOLE 20 MG CAPSULE,DELAYED RELEASE
ORAL_CAPSULE | 0 refills | 0 days
Start: 2022-08-22 — End: ?

## 2022-08-23 MED ORDER — OMEPRAZOLE 20 MG CAPSULE,DELAYED RELEASE
ORAL_CAPSULE | Freq: Every day | ORAL | 3 refills | 90 days | Status: CP
Start: 2022-08-23 — End: ?

## 2022-08-24 ENCOUNTER — Ambulatory Visit: Admit: 2022-08-24 | Payer: MEDICAID | Attending: Marriage & Family Therapist | Primary: Marriage & Family Therapist

## 2022-08-26 DIAGNOSIS — B977 Papillomavirus as the cause of diseases classified elsewhere: Principal | ICD-10-CM

## 2022-08-31 ENCOUNTER — Telehealth
Admit: 2022-08-31 | Discharge: 2022-09-01 | Payer: MEDICAID | Attending: Marriage & Family Therapist | Primary: Marriage & Family Therapist

## 2022-08-31 DIAGNOSIS — F4310 Post-traumatic stress disorder, unspecified: Principal | ICD-10-CM

## 2022-08-31 DIAGNOSIS — F1190 Opioid use, unspecified, uncomplicated: Principal | ICD-10-CM

## 2022-09-14 ENCOUNTER — Telehealth
Admit: 2022-09-14 | Discharge: 2022-09-15 | Payer: MEDICAID | Attending: Marriage & Family Therapist | Primary: Marriage & Family Therapist

## 2022-09-14 DIAGNOSIS — F1190 Opioid use, unspecified, uncomplicated: Principal | ICD-10-CM

## 2022-09-14 DIAGNOSIS — F4310 Post-traumatic stress disorder, unspecified: Principal | ICD-10-CM

## 2022-09-21 ENCOUNTER — Telehealth
Admit: 2022-09-21 | Discharge: 2022-09-22 | Payer: MEDICAID | Attending: Marriage & Family Therapist | Primary: Marriage & Family Therapist

## 2022-09-21 DIAGNOSIS — F1190 Opioid use, unspecified, uncomplicated: Principal | ICD-10-CM

## 2022-09-21 DIAGNOSIS — F4310 Post-traumatic stress disorder, unspecified: Principal | ICD-10-CM

## 2022-09-26 ENCOUNTER — Ambulatory Visit: Admit: 2022-09-26 | Discharge: 2022-09-27 | Payer: MEDICAID

## 2022-09-26 ENCOUNTER — Ambulatory Visit
Admit: 2022-09-26 | Discharge: 2022-09-27 | Payer: MEDICAID | Attending: Marriage & Family Therapist | Primary: Marriage & Family Therapist

## 2022-09-26 DIAGNOSIS — F4310 Post-traumatic stress disorder, unspecified: Principal | ICD-10-CM

## 2022-09-26 DIAGNOSIS — R928 Other abnormal and inconclusive findings on diagnostic imaging of breast: Principal | ICD-10-CM

## 2022-09-26 DIAGNOSIS — F1190 Opioid use, unspecified, uncomplicated: Principal | ICD-10-CM

## 2022-10-02 ENCOUNTER — Telehealth
Admit: 2022-10-02 | Discharge: 2022-10-03 | Payer: MEDICAID | Attending: Psychiatric/Mental Health | Primary: Psychiatric/Mental Health

## 2022-10-02 DIAGNOSIS — F4310 Post-traumatic stress disorder, unspecified: Principal | ICD-10-CM

## 2022-10-02 DIAGNOSIS — F5105 Insomnia due to other mental disorder: Principal | ICD-10-CM

## 2022-10-02 DIAGNOSIS — F99 Mental disorder, not otherwise specified: Principal | ICD-10-CM

## 2022-10-07 DIAGNOSIS — M5440 Lumbago with sciatica, unspecified side: Principal | ICD-10-CM

## 2022-10-07 DIAGNOSIS — G8929 Other chronic pain: Principal | ICD-10-CM

## 2022-10-07 MED ORDER — GABAPENTIN 600 MG TABLET
ORAL_TABLET | 0 refills | 0 days
Start: 2022-10-07 — End: ?

## 2022-10-09 MED ORDER — GABAPENTIN 600 MG TABLET
ORAL_TABLET | 0 refills | 0 days | Status: CP
Start: 2022-10-09 — End: ?

## 2022-10-12 DIAGNOSIS — G8929 Other chronic pain: Principal | ICD-10-CM

## 2022-10-12 DIAGNOSIS — M5440 Lumbago with sciatica, unspecified side: Principal | ICD-10-CM

## 2022-10-12 MED ORDER — GABAPENTIN 600 MG TABLET
ORAL_TABLET | 0 refills | 0 days
Start: 2022-10-12 — End: ?

## 2022-10-13 MED ORDER — GABAPENTIN 600 MG TABLET
ORAL_TABLET | Freq: Three times a day (TID) | ORAL | 3 refills | 90 days | Status: CP
Start: 2022-10-13 — End: 2023-10-13

## 2022-10-15 ENCOUNTER — Telehealth: Admit: 2022-10-15 | Payer: MEDICAID | Attending: Marriage & Family Therapist | Primary: Marriage & Family Therapist

## 2022-10-15 DIAGNOSIS — G8929 Other chronic pain: Principal | ICD-10-CM

## 2022-10-15 DIAGNOSIS — M5440 Lumbago with sciatica, unspecified side: Principal | ICD-10-CM

## 2022-10-15 MED ORDER — IBUPROFEN 600 MG TABLET
ORAL_TABLET | Freq: Four times a day (QID) | ORAL | 2 refills | 15 days | Status: CP | PRN
Start: 2022-10-15 — End: 2023-10-15

## 2022-11-05 ENCOUNTER — Ambulatory Visit: Admit: 2022-11-05 | Discharge: 2022-11-06 | Payer: MEDICAID

## 2022-11-05 ENCOUNTER — Ambulatory Visit
Admit: 2022-11-05 | Discharge: 2022-11-06 | Payer: MEDICAID | Attending: Marriage & Family Therapist | Primary: Marriage & Family Therapist

## 2022-11-05 DIAGNOSIS — F1190 Opioid use, unspecified, uncomplicated: Principal | ICD-10-CM

## 2022-11-05 DIAGNOSIS — F4310 Post-traumatic stress disorder, unspecified: Principal | ICD-10-CM

## 2022-11-13 ENCOUNTER — Telehealth
Admit: 2022-11-13 | Discharge: 2022-11-14 | Payer: MEDICAID | Attending: Psychiatric/Mental Health | Primary: Psychiatric/Mental Health

## 2022-11-16 ENCOUNTER — Ambulatory Visit
Admit: 2022-11-16 | Discharge: 2022-11-17 | Payer: MEDICAID | Attending: Physical Medicine & Rehabilitation | Primary: Physical Medicine & Rehabilitation

## 2022-11-16 ENCOUNTER — Ambulatory Visit: Admit: 2022-11-16 | Discharge: 2022-11-17 | Payer: MEDICAID

## 2022-11-21 ENCOUNTER — Telehealth
Admit: 2022-11-21 | Discharge: 2022-11-22 | Payer: MEDICAID | Attending: Marriage & Family Therapist | Primary: Marriage & Family Therapist

## 2022-11-21 DIAGNOSIS — F4310 Post-traumatic stress disorder, unspecified: Principal | ICD-10-CM

## 2022-11-21 DIAGNOSIS — F1190 Opioid use, unspecified, uncomplicated: Principal | ICD-10-CM

## 2022-11-23 DIAGNOSIS — E041 Nontoxic single thyroid nodule: Principal | ICD-10-CM

## 2022-11-23 MED ORDER — LEVOTHYROXINE 150 MCG TABLET
ORAL_TABLET | Freq: Every day | ORAL | 3 refills | 90 days
Start: 2022-11-23 — End: 2023-11-23

## 2022-11-28 DIAGNOSIS — E041 Nontoxic single thyroid nodule: Principal | ICD-10-CM

## 2022-11-28 MED ORDER — LEVOTHYROXINE 150 MCG TABLET
ORAL_TABLET | Freq: Every day | ORAL | 3 refills | 90 days
Start: 2022-11-28 — End: 2023-11-28

## 2022-11-30 MED ORDER — LEVOTHYROXINE 150 MCG TABLET
ORAL_TABLET | Freq: Every day | ORAL | 3 refills | 90 days
Start: 2022-11-30 — End: 2023-11-30

## 2022-12-10 ENCOUNTER — Ambulatory Visit: Admit: 2022-12-10 | Payer: MEDICAID | Attending: Marriage & Family Therapist | Primary: Marriage & Family Therapist

## 2022-12-11 ENCOUNTER — Telehealth
Admit: 2022-12-11 | Discharge: 2022-12-12 | Payer: MEDICAID | Attending: Psychiatric/Mental Health | Primary: Psychiatric/Mental Health

## 2022-12-11 DIAGNOSIS — F99 Mental disorder, not otherwise specified: Principal | ICD-10-CM

## 2022-12-11 DIAGNOSIS — F4310 Post-traumatic stress disorder, unspecified: Principal | ICD-10-CM

## 2022-12-11 DIAGNOSIS — F5105 Insomnia due to other mental disorder: Principal | ICD-10-CM

## 2022-12-11 DIAGNOSIS — F1190 Opioid use, unspecified, uncomplicated: Principal | ICD-10-CM

## 2022-12-11 MED ORDER — RISPERIDONE 2 MG TABLET
ORAL_TABLET | Freq: Every day | ORAL | 0 refills | 90 days | Status: CP
Start: 2022-12-11 — End: ?

## 2022-12-11 MED ORDER — PRAZOSIN 1 MG CAPSULE
ORAL_CAPSULE | 0 refills | 0 days | Status: CP
Start: 2022-12-11 — End: ?

## 2022-12-11 MED ORDER — PRAZOSIN 5 MG CAPSULE
ORAL_CAPSULE | 0 refills | 0 days | Status: CP
Start: 2022-12-11 — End: ?

## 2022-12-24 ENCOUNTER — Telehealth
Admit: 2022-12-24 | Discharge: 2022-12-25 | Payer: MEDICAID | Attending: Marriage & Family Therapist | Primary: Marriage & Family Therapist

## 2022-12-24 DIAGNOSIS — F1190 Opioid use, unspecified, uncomplicated: Principal | ICD-10-CM

## 2022-12-24 DIAGNOSIS — F4310 Post-traumatic stress disorder, unspecified: Principal | ICD-10-CM

## 2023-01-02 ENCOUNTER — Telehealth
Admit: 2023-01-02 | Discharge: 2023-01-03 | Payer: MEDICAID | Attending: Marriage & Family Therapist | Primary: Marriage & Family Therapist

## 2023-01-09 ENCOUNTER — Telehealth
Admit: 2023-01-09 | Discharge: 2023-01-10 | Payer: MEDICAID | Attending: Marriage & Family Therapist | Primary: Marriage & Family Therapist

## 2023-01-23 ENCOUNTER — Telehealth
Admit: 2023-01-23 | Discharge: 2023-01-23 | Payer: MEDICAID | Attending: Marriage & Family Therapist | Primary: Marriage & Family Therapist

## 2023-01-23 ENCOUNTER — Telehealth
Admit: 2023-01-23 | Discharge: 2023-01-23 | Payer: MEDICAID | Attending: Psychiatric/Mental Health | Primary: Psychiatric/Mental Health

## 2023-01-23 DIAGNOSIS — F321 Major depressive disorder, single episode, moderate: Principal | ICD-10-CM

## 2023-01-23 DIAGNOSIS — F4310 Post-traumatic stress disorder, unspecified: Principal | ICD-10-CM

## 2023-01-23 DIAGNOSIS — F411 Generalized anxiety disorder: Principal | ICD-10-CM

## 2023-01-28 DIAGNOSIS — R7303 Prediabetes: Principal | ICD-10-CM

## 2023-02-20 ENCOUNTER — Telehealth
Admit: 2023-02-20 | Discharge: 2023-02-21 | Payer: MEDICAID | Attending: Psychiatric/Mental Health | Primary: Psychiatric/Mental Health

## 2023-02-20 ENCOUNTER — Telehealth
Admit: 2023-02-20 | Discharge: 2023-02-21 | Payer: MEDICAID | Attending: Marriage & Family Therapist | Primary: Marriage & Family Therapist

## 2023-02-20 MED ORDER — RISPERIDONE 2 MG TABLET
ORAL_TABLET | Freq: Every day | ORAL | 0 refills | 90 days | Status: CP
Start: 2023-02-20 — End: ?

## 2023-02-20 MED ORDER — PRAZOSIN 5 MG CAPSULE
ORAL_CAPSULE | 0 refills | 0 days | Status: CP
Start: 2023-02-20 — End: ?

## 2023-02-20 MED ORDER — PRAZOSIN 1 MG CAPSULE
ORAL_CAPSULE | 0 refills | 0 days | Status: CP
Start: 2023-02-20 — End: ?

## 2023-03-05 MED ORDER — PRAZOSIN 1 MG CAPSULE
ORAL_CAPSULE | 0 refills | 0 days
Start: 2023-03-05 — End: ?

## 2023-03-08 MED ORDER — PRAZOSIN 1 MG CAPSULE
ORAL_CAPSULE | 0 refills | 0 days
Start: 2023-03-08 — End: ?

## 2023-03-11 MED ORDER — BUPROPION HCL SR 150 MG TABLET,12 HR SUSTAINED-RELEASE
ORAL_TABLET | Freq: Two times a day (BID) | ORAL | 0 refills | 90 days | Status: CP
Start: 2023-03-11 — End: 2023-06-09

## 2023-03-12 ENCOUNTER — Telehealth
Admit: 2023-03-12 | Discharge: 2023-03-13 | Payer: MEDICAID | Attending: Marriage & Family Therapist | Primary: Marriage & Family Therapist

## 2023-03-19 DIAGNOSIS — J4520 Mild intermittent asthma, uncomplicated: Principal | ICD-10-CM

## 2023-03-19 MED ORDER — VENTOLIN HFA 90 MCG/ACTUATION AEROSOL INHALER
2 refills | 0 days
Start: 2023-03-19 — End: ?

## 2023-03-20 MED ORDER — VENTOLIN HFA 90 MCG/ACTUATION AEROSOL INHALER
2 refills | 0 days | Status: CP
Start: 2023-03-20 — End: ?

## 2023-03-27 ENCOUNTER — Telehealth
Admit: 2023-03-27 | Discharge: 2023-03-28 | Payer: PRIVATE HEALTH INSURANCE | Attending: Marriage & Family Therapist | Primary: Marriage & Family Therapist

## 2023-03-27 DIAGNOSIS — F4310 Post-traumatic stress disorder, unspecified: Principal | ICD-10-CM

## 2023-03-27 DIAGNOSIS — F321 Major depressive disorder, single episode, moderate: Principal | ICD-10-CM

## 2023-04-03 ENCOUNTER — Telehealth
Admit: 2023-04-03 | Discharge: 2023-04-04 | Payer: PRIVATE HEALTH INSURANCE | Attending: Psychiatric/Mental Health | Primary: Psychiatric/Mental Health

## 2023-04-03 DIAGNOSIS — F321 Major depressive disorder, single episode, moderate: Principal | ICD-10-CM

## 2023-04-03 DIAGNOSIS — F4310 Post-traumatic stress disorder, unspecified: Principal | ICD-10-CM

## 2023-04-03 MED ORDER — PRAZOSIN 5 MG CAPSULE
ORAL_CAPSULE | 0 refills | 0 days | Status: CP
Start: 2023-04-03 — End: ?

## 2023-04-03 MED ORDER — PRAZOSIN 1 MG CAPSULE
ORAL_CAPSULE | 0 refills | 0 days | Status: CP
Start: 2023-04-03 — End: ?

## 2023-04-03 MED ORDER — RISPERIDONE 2 MG TABLET
ORAL_TABLET | Freq: Every day | ORAL | 0 refills | 90 days | Status: CP
Start: 2023-04-03 — End: ?

## 2023-04-17 ENCOUNTER — Ambulatory Visit
Admit: 2023-04-17 | Discharge: 2023-04-18 | Payer: PRIVATE HEALTH INSURANCE | Attending: Physical Medicine & Rehabilitation | Primary: Physical Medicine & Rehabilitation

## 2023-04-17 ENCOUNTER — Ambulatory Visit
Admit: 2023-04-17 | Discharge: 2023-04-18 | Payer: PRIVATE HEALTH INSURANCE | Attending: Marriage & Family Therapist | Primary: Marriage & Family Therapist

## 2023-04-17 DIAGNOSIS — G8929 Other chronic pain: Principal | ICD-10-CM

## 2023-04-17 DIAGNOSIS — M25562 Pain in left knee: Principal | ICD-10-CM

## 2023-04-17 DIAGNOSIS — M1710 Unilateral primary osteoarthritis, unspecified knee: Principal | ICD-10-CM

## 2023-04-17 DIAGNOSIS — M25561 Pain in right knee: Principal | ICD-10-CM

## 2023-04-17 DIAGNOSIS — F4310 Post-traumatic stress disorder, unspecified: Principal | ICD-10-CM

## 2023-04-17 DIAGNOSIS — F321 Major depressive disorder, single episode, moderate: Principal | ICD-10-CM

## 2023-05-01 ENCOUNTER — Telehealth
Admit: 2023-05-01 | Discharge: 2023-05-02 | Payer: PRIVATE HEALTH INSURANCE | Attending: Marriage & Family Therapist | Primary: Marriage & Family Therapist

## 2023-05-01 DIAGNOSIS — F4310 Post-traumatic stress disorder, unspecified: Principal | ICD-10-CM

## 2023-05-01 DIAGNOSIS — F321 Major depressive disorder, single episode, moderate: Principal | ICD-10-CM

## 2023-05-13 DIAGNOSIS — E041 Nontoxic single thyroid nodule: Principal | ICD-10-CM

## 2023-05-13 DIAGNOSIS — E033 Postinfectious hypothyroidism: Principal | ICD-10-CM

## 2023-05-14 DIAGNOSIS — E033 Postinfectious hypothyroidism: Principal | ICD-10-CM

## 2023-05-30 ENCOUNTER — Telehealth
Admit: 2023-05-30 | Discharge: 2023-05-31 | Payer: PRIVATE HEALTH INSURANCE | Attending: Marriage & Family Therapist | Primary: Marriage & Family Therapist

## 2023-06-06 ENCOUNTER — Telehealth
Admit: 2023-06-06 | Discharge: 2023-06-07 | Payer: PRIVATE HEALTH INSURANCE | Attending: Marriage & Family Therapist | Primary: Marriage & Family Therapist

## 2023-06-11 ENCOUNTER — Telehealth
Admit: 2023-06-11 | Discharge: 2023-06-12 | Payer: PRIVATE HEALTH INSURANCE | Attending: Psychiatric/Mental Health | Primary: Psychiatric/Mental Health

## 2023-06-11 DIAGNOSIS — F4310 Post-traumatic stress disorder, unspecified: Principal | ICD-10-CM

## 2023-06-11 DIAGNOSIS — F411 Generalized anxiety disorder: Principal | ICD-10-CM

## 2023-06-11 MED ORDER — PRAZOSIN 5 MG CAPSULE
ORAL_CAPSULE | 0 refills | 0 days | Status: CP
Start: 2023-06-11 — End: ?

## 2023-06-11 MED ORDER — BUPROPION HCL SR 150 MG TABLET,12 HR SUSTAINED-RELEASE
ORAL_TABLET | Freq: Two times a day (BID) | ORAL | 0 refills | 90 days | Status: CP
Start: 2023-06-11 — End: 2023-09-09

## 2023-06-11 MED ORDER — RISPERIDONE 2 MG TABLET
ORAL_TABLET | Freq: Every day | ORAL | 0 refills | 90 days | Status: CP
Start: 2023-06-11 — End: ?

## 2023-06-11 MED ORDER — PRAZOSIN 1 MG CAPSULE
ORAL_CAPSULE | 0 refills | 0 days | Status: CP
Start: 2023-06-11 — End: ?

## 2023-06-18 ENCOUNTER — Ambulatory Visit
Admit: 2023-06-18 | Discharge: 2023-06-19 | Payer: PRIVATE HEALTH INSURANCE | Attending: Marriage & Family Therapist | Primary: Marriage & Family Therapist

## 2023-06-18 ENCOUNTER — Ambulatory Visit: Admit: 2023-06-18 | Discharge: 2023-06-19 | Payer: PRIVATE HEALTH INSURANCE

## 2023-06-18 MED ORDER — AMLODIPINE 10 MG TABLET
ORAL | 3 refills | 90 days | Status: CP
Start: 2023-06-18 — End: 2024-06-17

## 2023-06-18 MED ORDER — OMEPRAZOLE 20 MG CAPSULE,DELAYED RELEASE
ORAL_CAPSULE | Freq: Every day | ORAL | 3 refills | 90 days | Status: CP
Start: 2023-06-18 — End: ?

## 2023-06-18 MED ORDER — LEVOTHYROXINE 150 MCG TABLET
ORAL_TABLET | Freq: Every day | ORAL | 3 refills | 90 days | Status: CP
Start: 2023-06-18 — End: 2024-06-17

## 2023-07-03 ENCOUNTER — Telehealth
Admit: 2023-07-03 | Discharge: 2023-07-04 | Payer: PRIVATE HEALTH INSURANCE | Attending: Marriage & Family Therapist | Primary: Marriage & Family Therapist

## 2023-07-03 DIAGNOSIS — F4310 Post-traumatic stress disorder, unspecified: Principal | ICD-10-CM

## 2023-07-03 DIAGNOSIS — N951 Menopausal and female climacteric states: Principal | ICD-10-CM

## 2023-07-03 DIAGNOSIS — F321 Major depressive disorder, single episode, moderate: Principal | ICD-10-CM

## 2023-07-03 DIAGNOSIS — Z780 Asymptomatic menopausal state: Principal | ICD-10-CM

## 2023-07-03 MED ORDER — PROGESTERONE MICRONIZED 100 MG CAPSULE
ORAL_CAPSULE | Freq: Every day | ORAL | 2 refills | 30 days | Status: CP
Start: 2023-07-03 — End: 2023-10-01

## 2023-07-03 MED ORDER — ESTRADIOL 0.5 MG TABLET
ORAL_TABLET | Freq: Every day | ORAL | 2 refills | 30 days | Status: CP
Start: 2023-07-03 — End: 2023-10-01

## 2023-07-19 ENCOUNTER — Ambulatory Visit: Admit: 2023-07-19 | Discharge: 2023-07-19 | Payer: PRIVATE HEALTH INSURANCE

## 2023-07-19 ENCOUNTER — Ambulatory Visit
Admit: 2023-07-19 | Discharge: 2023-07-19 | Payer: PRIVATE HEALTH INSURANCE | Attending: Physical Medicine & Rehabilitation | Primary: Physical Medicine & Rehabilitation

## 2023-07-19 ENCOUNTER — Ambulatory Visit
Admit: 2023-07-19 | Discharge: 2023-07-20 | Payer: PRIVATE HEALTH INSURANCE | Attending: Marriage & Family Therapist | Primary: Marriage & Family Therapist

## 2023-07-19 DIAGNOSIS — M25561 Pain in right knee: Principal | ICD-10-CM

## 2023-07-19 DIAGNOSIS — G8929 Other chronic pain: Principal | ICD-10-CM

## 2023-07-19 DIAGNOSIS — M25562 Pain in left knee: Principal | ICD-10-CM

## 2023-07-19 DIAGNOSIS — F4310 Post-traumatic stress disorder, unspecified: Principal | ICD-10-CM

## 2023-07-28 DIAGNOSIS — Z780 Asymptomatic menopausal state: Principal | ICD-10-CM

## 2023-07-28 DIAGNOSIS — N951 Menopausal and female climacteric states: Principal | ICD-10-CM

## 2023-07-28 MED ORDER — ESTRADIOL 0.5 MG TABLET
ORAL_TABLET | Freq: Every day | ORAL | 1 refills | 0 days
Start: 2023-07-28 — End: ?

## 2023-07-29 MED ORDER — ESTRADIOL 0.5 MG TABLET
ORAL_TABLET | Freq: Every day | ORAL | 1 refills | 90 days | Status: CP
Start: 2023-07-29 — End: ?

## 2023-08-01 ENCOUNTER — Telehealth
Admit: 2023-08-01 | Discharge: 2023-08-02 | Payer: PRIVATE HEALTH INSURANCE | Attending: Psychiatric/Mental Health | Primary: Psychiatric/Mental Health

## 2023-08-01 DIAGNOSIS — F4310 Post-traumatic stress disorder, unspecified: Principal | ICD-10-CM

## 2023-08-01 DIAGNOSIS — F411 Generalized anxiety disorder: Principal | ICD-10-CM

## 2023-08-01 DIAGNOSIS — F321 Major depressive disorder, single episode, moderate: Principal | ICD-10-CM

## 2023-08-12 ENCOUNTER — Telehealth
Admit: 2023-08-12 | Discharge: 2023-08-13 | Payer: PRIVATE HEALTH INSURANCE | Attending: Marriage & Family Therapist | Primary: Marriage & Family Therapist

## 2023-08-12 DIAGNOSIS — F4310 Post-traumatic stress disorder, unspecified: Principal | ICD-10-CM

## 2023-08-12 DIAGNOSIS — F321 Major depressive disorder, single episode, moderate: Principal | ICD-10-CM

## 2023-08-28 MED ORDER — PRAZOSIN 1 MG CAPSULE
ORAL_CAPSULE | 0 refills | 0 days | Status: CP
Start: 2023-08-28 — End: ?

## 2023-09-04 ENCOUNTER — Telehealth
Admit: 2023-09-04 | Discharge: 2023-09-05 | Payer: PRIVATE HEALTH INSURANCE | Attending: Marriage & Family Therapist | Primary: Marriage & Family Therapist

## 2023-09-04 DIAGNOSIS — F321 Major depressive disorder, single episode, moderate: Principal | ICD-10-CM

## 2023-09-04 DIAGNOSIS — F4310 Post-traumatic stress disorder, unspecified: Principal | ICD-10-CM

## 2023-09-30 ENCOUNTER — Encounter
Admit: 2023-09-30 | Discharge: 2023-10-01 | Payer: PRIVATE HEALTH INSURANCE | Attending: Marriage & Family Therapist | Primary: Marriage & Family Therapist

## 2023-09-30 DIAGNOSIS — F4310 Post-traumatic stress disorder, unspecified: Principal | ICD-10-CM

## 2023-09-30 DIAGNOSIS — F321 Major depressive disorder, single episode, moderate: Principal | ICD-10-CM

## 2023-10-10 ENCOUNTER — Encounter
Admit: 2023-10-10 | Discharge: 2023-10-11 | Payer: PRIVATE HEALTH INSURANCE | Attending: Psychiatric/Mental Health | Primary: Psychiatric/Mental Health

## 2023-10-10 DIAGNOSIS — F1190 Opioid use, unspecified, uncomplicated: Principal | ICD-10-CM

## 2023-10-10 DIAGNOSIS — F99 Mental disorder, not otherwise specified: Principal | ICD-10-CM

## 2023-10-10 DIAGNOSIS — F4310 Post-traumatic stress disorder, unspecified: Principal | ICD-10-CM

## 2023-10-10 DIAGNOSIS — F5105 Insomnia due to other mental disorder: Principal | ICD-10-CM

## 2023-10-10 DIAGNOSIS — F411 Generalized anxiety disorder: Principal | ICD-10-CM

## 2023-10-10 MED ORDER — RISPERIDONE 2 MG TABLET
ORAL_TABLET | Freq: Every day | ORAL | 0 refills | 90.00 days | Status: CP
Start: 2023-10-10 — End: ?

## 2023-10-10 MED ORDER — BUPROPION HCL SR 150 MG TABLET,12 HR SUSTAINED-RELEASE
ORAL_TABLET | Freq: Two times a day (BID) | ORAL | 0 refills | 90.00 days | Status: CP
Start: 2023-10-10 — End: 2024-01-08

## 2023-10-10 MED ORDER — PRAZOSIN 5 MG CAPSULE
ORAL_CAPSULE | 0 refills | 0.00 days | Status: CP
Start: 2023-10-10 — End: ?

## 2023-10-21 ENCOUNTER — Encounter: Admit: 2023-10-21 | Discharge: 2023-10-22 | Payer: PRIVATE HEALTH INSURANCE

## 2023-10-21 DIAGNOSIS — E041 Nontoxic single thyroid nodule: Principal | ICD-10-CM

## 2023-10-21 DIAGNOSIS — E033 Postinfectious hypothyroidism: Principal | ICD-10-CM

## 2023-10-21 DIAGNOSIS — K760 Fatty (change of) liver, not elsewhere classified: Principal | ICD-10-CM

## 2023-10-21 DIAGNOSIS — E538 Deficiency of other specified B group vitamins: Principal | ICD-10-CM

## 2023-10-21 MED ORDER — METFORMIN 500 MG TABLET
ORAL_TABLET | Freq: Two times a day (BID) | ORAL | 3 refills | 90.00 days | Status: CP
Start: 2023-10-21 — End: 2024-10-20

## 2023-10-23 ENCOUNTER — Ambulatory Visit: Admit: 2023-10-23 | Discharge: 2023-10-23 | Payer: PRIVATE HEALTH INSURANCE

## 2023-10-23 ENCOUNTER — Ambulatory Visit
Admit: 2023-10-23 | Discharge: 2023-10-24 | Payer: PRIVATE HEALTH INSURANCE | Attending: Marriage & Family Therapist | Primary: Marriage & Family Therapist

## 2023-10-23 ENCOUNTER — Ambulatory Visit
Admit: 2023-10-23 | Discharge: 2023-10-23 | Payer: PRIVATE HEALTH INSURANCE | Attending: Physical Medicine & Rehabilitation | Primary: Physical Medicine & Rehabilitation

## 2023-10-23 DIAGNOSIS — F99 Mental disorder, not otherwise specified: Principal | ICD-10-CM

## 2023-10-23 DIAGNOSIS — E538 Deficiency of other specified B group vitamins: Principal | ICD-10-CM

## 2023-10-23 DIAGNOSIS — F5105 Insomnia due to other mental disorder: Principal | ICD-10-CM

## 2023-10-23 DIAGNOSIS — M7918 Myalgia, other site: Principal | ICD-10-CM

## 2023-10-23 DIAGNOSIS — F4310 Post-traumatic stress disorder, unspecified: Principal | ICD-10-CM

## 2023-10-23 DIAGNOSIS — M170 Bilateral primary osteoarthritis of knee: Principal | ICD-10-CM

## 2023-10-23 DIAGNOSIS — K760 Fatty (change of) liver, not elsewhere classified: Principal | ICD-10-CM

## 2023-10-23 DIAGNOSIS — E033 Postinfectious hypothyroidism: Principal | ICD-10-CM

## 2023-10-23 DIAGNOSIS — E041 Nontoxic single thyroid nodule: Principal | ICD-10-CM

## 2023-10-23 DIAGNOSIS — F1190 Opioid use, unspecified, uncomplicated: Principal | ICD-10-CM

## 2023-11-03 DIAGNOSIS — N951 Menopausal and female climacteric states: Principal | ICD-10-CM

## 2023-11-03 DIAGNOSIS — Z780 Asymptomatic menopausal state: Principal | ICD-10-CM

## 2023-11-03 DIAGNOSIS — G8929 Other chronic pain: Principal | ICD-10-CM

## 2023-11-03 DIAGNOSIS — M5440 Lumbago with sciatica, unspecified side: Principal | ICD-10-CM

## 2023-11-03 MED ORDER — PROGESTERONE MICRONIZED 100 MG CAPSULE
ORAL_CAPSULE | Freq: Every day | ORAL | 0 refills | 0.00 days
Start: 2023-11-03 — End: ?

## 2023-11-03 MED ORDER — IBUPROFEN 600 MG TABLET
ORAL_TABLET | Freq: Four times a day (QID) | ORAL | 2 refills | 0.00 days | PRN
Start: 2023-11-03 — End: ?

## 2023-11-03 MED ORDER — GABAPENTIN 600 MG TABLET
ORAL_TABLET | Freq: Three times a day (TID) | ORAL | 3 refills | 0.00 days
Start: 2023-11-03 — End: ?

## 2023-11-04 MED ORDER — PROGESTERONE MICRONIZED 100 MG CAPSULE
ORAL_CAPSULE | Freq: Every day | ORAL | 0 refills | 90.00 days | Status: CP
Start: 2023-11-04 — End: ?

## 2023-11-04 MED ORDER — IBUPROFEN 600 MG TABLET
ORAL_TABLET | Freq: Four times a day (QID) | ORAL | 2 refills | 15 days | Status: CP | PRN
Start: 2023-11-04 — End: ?

## 2023-11-04 MED ORDER — GABAPENTIN 600 MG TABLET
ORAL_TABLET | Freq: Three times a day (TID) | ORAL | 3 refills | 90.00 days | Status: CP
Start: 2023-11-04 — End: 2024-11-03

## 2023-11-05 DIAGNOSIS — I10 Essential (primary) hypertension: Principal | ICD-10-CM

## 2023-11-05 DIAGNOSIS — R7303 Prediabetes: Principal | ICD-10-CM

## 2023-11-05 DIAGNOSIS — K760 Fatty (change of) liver, not elsewhere classified: Principal | ICD-10-CM

## 2023-11-05 MED ORDER — SAXENDA 3 MG/0.5 ML (18 MG/3 ML) SUBCUTANEOUS PEN INJECTOR
SUBCUTANEOUS | 0 refills | 28.00 days | Status: CP
Start: 2023-11-05 — End: 2023-12-03

## 2023-11-07 ENCOUNTER — Encounter
Admit: 2023-11-07 | Discharge: 2023-11-08 | Payer: PRIVATE HEALTH INSURANCE | Attending: Marriage & Family Therapist | Primary: Marriage & Family Therapist

## 2023-11-07 DIAGNOSIS — F99 Mental disorder, not otherwise specified: Principal | ICD-10-CM

## 2023-11-07 DIAGNOSIS — F5105 Insomnia due to other mental disorder: Principal | ICD-10-CM

## 2023-11-07 DIAGNOSIS — F4310 Post-traumatic stress disorder, unspecified: Principal | ICD-10-CM

## 2023-11-18 MED ORDER — BUPROPION HCL SR 150 MG TABLET,12 HR SUSTAINED-RELEASE
Freq: Two times a day (BID) | ORAL | 0 refills | 0.00 days
Start: 2023-11-18 — End: ?

## 2023-11-21 ENCOUNTER — Encounter
Admit: 2023-11-21 | Discharge: 2023-11-22 | Payer: PRIVATE HEALTH INSURANCE | Attending: Psychiatric/Mental Health | Primary: Psychiatric/Mental Health

## 2023-11-21 DIAGNOSIS — F4310 Post-traumatic stress disorder, unspecified: Principal | ICD-10-CM

## 2023-11-21 DIAGNOSIS — F411 Generalized anxiety disorder: Principal | ICD-10-CM

## 2023-12-03 MED ORDER — SAXENDA 3 MG/0.5 ML (18 MG/3 ML) SUBCUTANEOUS PEN INJECTOR
Freq: Every day | SUBCUTANEOUS | 11 refills | 30.00 days | Status: CP
Start: 2023-12-03 — End: ?

## 2023-12-07 ENCOUNTER — Encounter: Admit: 2023-12-07 | Discharge: 2023-12-08 | Payer: PRIVATE HEALTH INSURANCE

## 2023-12-07 DIAGNOSIS — U071 COVID-19 virus infection: Principal | ICD-10-CM

## 2023-12-07 MED ORDER — PAXLOVID 300 MG (150 MG X 2)-100 MG TABLETS IN A DOSE PACK
ORAL_TABLET | 0 refills | 0.00 days | Status: CP
Start: 2023-12-07 — End: ?

## 2023-12-09 MED ORDER — PRAZOSIN 1 MG CAPSULE
ORAL_CAPSULE | 0 refills | 0.00 days
Start: 2023-12-09 — End: ?

## 2023-12-10 MED ORDER — PRAZOSIN 1 MG CAPSULE
ORAL_CAPSULE | 0 refills | 0.00 days | Status: CP
Start: 2023-12-10 — End: ?

## 2023-12-11 ENCOUNTER — Encounter
Admit: 2023-12-11 | Discharge: 2023-12-12 | Payer: PRIVATE HEALTH INSURANCE | Attending: Marriage & Family Therapist | Primary: Marriage & Family Therapist

## 2023-12-11 DIAGNOSIS — F411 Generalized anxiety disorder: Principal | ICD-10-CM

## 2023-12-11 DIAGNOSIS — F4310 Post-traumatic stress disorder, unspecified: Principal | ICD-10-CM

## 2024-01-01 ENCOUNTER — Encounter
Admit: 2024-01-01 | Discharge: 2024-01-02 | Attending: Marriage & Family Therapist | Primary: Marriage & Family Therapist

## 2024-01-01 DIAGNOSIS — F5105 Insomnia due to other mental disorder: Principal | ICD-10-CM

## 2024-01-01 DIAGNOSIS — F411 Generalized anxiety disorder: Principal | ICD-10-CM

## 2024-01-01 DIAGNOSIS — F4310 Post-traumatic stress disorder, unspecified: Principal | ICD-10-CM

## 2024-01-01 DIAGNOSIS — F99 Mental disorder, not otherwise specified: Principal | ICD-10-CM

## 2024-01-02 ENCOUNTER — Ambulatory Visit: Admit: 2024-01-02 | Attending: Psychiatric/Mental Health | Primary: Psychiatric/Mental Health

## 2024-01-17 ENCOUNTER — Ambulatory Visit
Admit: 2024-01-17 | Discharge: 2024-01-18 | Payer: Medicaid (Managed Care) | Attending: Marriage & Family Therapist | Primary: Marriage & Family Therapist

## 2024-01-17 ENCOUNTER — Ambulatory Visit
Admit: 2024-01-17 | Discharge: 2024-01-18 | Payer: Medicaid (Managed Care) | Attending: Physical Medicine & Rehabilitation | Primary: Physical Medicine & Rehabilitation

## 2024-01-17 DIAGNOSIS — F4310 Post-traumatic stress disorder, unspecified: Principal | ICD-10-CM

## 2024-01-17 DIAGNOSIS — F5105 Insomnia due to other mental disorder: Principal | ICD-10-CM

## 2024-01-17 DIAGNOSIS — F99 Mental disorder, not otherwise specified: Principal | ICD-10-CM

## 2024-01-17 DIAGNOSIS — M1710 Unilateral primary osteoarthritis, unspecified knee: Principal | ICD-10-CM

## 2024-01-24 ENCOUNTER — Encounter
Admit: 2024-01-24 | Discharge: 2024-01-25 | Payer: Medicaid (Managed Care) | Attending: Psychiatric/Mental Health | Primary: Psychiatric/Mental Health

## 2024-01-24 DIAGNOSIS — F99 Mental disorder, not otherwise specified: Principal | ICD-10-CM

## 2024-01-24 DIAGNOSIS — F4310 Post-traumatic stress disorder, unspecified: Principal | ICD-10-CM

## 2024-01-24 DIAGNOSIS — F5105 Insomnia due to other mental disorder: Principal | ICD-10-CM

## 2024-01-24 MED ORDER — PRAZOSIN 5 MG CAPSULE
ORAL_CAPSULE | 0 refills | 0.00000 days | Status: CP
Start: 2024-01-24 — End: ?

## 2024-01-24 MED ORDER — RISPERIDONE 2 MG TABLET
ORAL_TABLET | Freq: Every day | ORAL | 0 refills | 90.00000 days | Status: CP
Start: 2024-01-24 — End: ?

## 2024-01-24 MED ORDER — PRAZOSIN 1 MG CAPSULE
ORAL_CAPSULE | 0 refills | 0.00000 days | Status: CP
Start: 2024-01-24 — End: ?

## 2024-01-24 MED ORDER — BUPROPION HCL SR 150 MG TABLET,12 HR SUSTAINED-RELEASE
ORAL_TABLET | Freq: Two times a day (BID) | ORAL | 0 refills | 90.00000 days | Status: CP
Start: 2024-01-24 — End: 2024-04-23

## 2024-01-28 ENCOUNTER — Encounter
Admit: 2024-01-28 | Discharge: 2024-01-29 | Payer: Medicaid (Managed Care) | Attending: Marriage & Family Therapist | Primary: Marriage & Family Therapist

## 2024-01-28 DIAGNOSIS — F411 Generalized anxiety disorder: Principal | ICD-10-CM

## 2024-01-28 DIAGNOSIS — F4310 Post-traumatic stress disorder, unspecified: Principal | ICD-10-CM

## 2024-01-30 DIAGNOSIS — M5442 Lumbago with sciatica, left side: Principal | ICD-10-CM

## 2024-01-30 DIAGNOSIS — G8929 Other chronic pain: Principal | ICD-10-CM

## 2024-01-30 DIAGNOSIS — M5441 Lumbago with sciatica, right side: Principal | ICD-10-CM

## 2024-01-30 DIAGNOSIS — N951 Menopausal and female climacteric states: Principal | ICD-10-CM

## 2024-01-30 DIAGNOSIS — Z780 Asymptomatic menopausal state: Principal | ICD-10-CM

## 2024-01-30 MED ORDER — ESTRADIOL 0.5 MG TABLET
ORAL_TABLET | Freq: Every day | ORAL | 1 refills | 90.00000 days | Status: CP
Start: 2024-01-30 — End: ?

## 2024-01-30 MED ORDER — IBUPROFEN 600 MG TABLET
ORAL_TABLET | Freq: Four times a day (QID) | ORAL | 2 refills | 15.00000 days | Status: CP | PRN
Start: 2024-01-30 — End: ?

## 2024-02-14 ENCOUNTER — Encounter
Admit: 2024-02-14 | Discharge: 2024-02-15 | Payer: Medicaid (Managed Care) | Attending: Marriage & Family Therapist | Primary: Marriage & Family Therapist

## 2024-02-14 DIAGNOSIS — F99 Mental disorder, not otherwise specified: Principal | ICD-10-CM

## 2024-02-14 DIAGNOSIS — F4310 Post-traumatic stress disorder, unspecified: Principal | ICD-10-CM

## 2024-02-14 DIAGNOSIS — F5105 Insomnia due to other mental disorder: Principal | ICD-10-CM

## 2024-03-02 MED ORDER — PRAZOSIN 1 MG CAPSULE
ORAL_CAPSULE | 1 refills | 0.00000 days
Start: 2024-03-02 — End: ?

## 2024-03-03 MED ORDER — PRAZOSIN 1 MG CAPSULE
ORAL_CAPSULE | 1 refills | 0.00000 days
Start: 2024-03-03 — End: ?

## 2024-03-12 DIAGNOSIS — F4310 Post-traumatic stress disorder, unspecified: Principal | ICD-10-CM

## 2024-03-12 DIAGNOSIS — F99 Mental disorder, not otherwise specified: Principal | ICD-10-CM

## 2024-03-12 DIAGNOSIS — F5105 Insomnia due to other mental disorder: Principal | ICD-10-CM

## 2024-03-17 DIAGNOSIS — F411 Generalized anxiety disorder: Principal | ICD-10-CM

## 2024-03-17 DIAGNOSIS — F4310 Post-traumatic stress disorder, unspecified: Principal | ICD-10-CM

## 2024-03-26 DIAGNOSIS — F4323 Adjustment disorder with mixed anxiety and depressed mood: Principal | ICD-10-CM

## 2024-03-26 DIAGNOSIS — F4310 Post-traumatic stress disorder, unspecified: Principal | ICD-10-CM

## 2024-04-06 DIAGNOSIS — F331 Major depressive disorder, recurrent, moderate: Principal | ICD-10-CM

## 2024-04-06 DIAGNOSIS — F4310 Post-traumatic stress disorder, unspecified: Principal | ICD-10-CM

## 2024-04-16 DIAGNOSIS — F4310 Post-traumatic stress disorder, unspecified: Principal | ICD-10-CM

## 2024-04-16 DIAGNOSIS — F331 Major depressive disorder, recurrent, moderate: Principal | ICD-10-CM

## 2024-04-16 DIAGNOSIS — F4321 Adjustment disorder with depressed mood: Principal | ICD-10-CM

## 2024-04-16 MED ORDER — PRAZOSIN 1 MG CAPSULE
ORAL_CAPSULE | ORAL | 0 refills | 0.00000 days | Status: CP
Start: 2024-04-16 — End: ?

## 2024-04-16 MED ORDER — RISPERIDONE 3 MG TABLET
ORAL_TABLET | Freq: Every day | ORAL | 0 refills | 90.00000 days | Status: CP
Start: 2024-04-16 — End: 2024-07-15

## 2024-04-16 MED ORDER — PRAZOSIN 5 MG CAPSULE
ORAL_CAPSULE | ORAL | 0 refills | 0.00000 days | Status: CP
Start: 2024-04-16 — End: ?

## 2024-04-16 MED ORDER — BUPROPION HCL SR 150 MG TABLET,12 HR SUSTAINED-RELEASE
ORAL_TABLET | Freq: Two times a day (BID) | ORAL | 0 refills | 90.00000 days | Status: CP
Start: 2024-04-16 — End: 2024-07-15

## 2024-04-17 ENCOUNTER — Ambulatory Visit
Admit: 2024-04-17 | Discharge: 2024-04-17 | Payer: Medicaid (Managed Care) | Attending: Orthopaedic Surgery | Primary: Orthopaedic Surgery

## 2024-04-17 ENCOUNTER — Inpatient Hospital Stay: Admit: 2024-04-17 | Discharge: 2024-04-17 | Payer: Medicaid (Managed Care)

## 2024-04-17 DIAGNOSIS — F4310 Post-traumatic stress disorder, unspecified: Principal | ICD-10-CM

## 2024-04-17 DIAGNOSIS — M170 Bilateral primary osteoarthritis of knee: Principal | ICD-10-CM

## 2024-04-17 DIAGNOSIS — F331 Major depressive disorder, recurrent, moderate: Principal | ICD-10-CM

## 2024-05-18 ENCOUNTER — Ambulatory Visit
Admit: 2024-05-18 | Payer: Medicaid (Managed Care) | Attending: Rehabilitative and Restorative Service Providers" | Primary: Rehabilitative and Restorative Service Providers"

## 2024-05-19 ENCOUNTER — Encounter: Admit: 2024-05-19 | Discharge: 2024-05-19 | Payer: Medicaid (Managed Care)

## 2024-05-19 ENCOUNTER — Inpatient Hospital Stay: Admit: 2024-05-19 | Discharge: 2024-05-19 | Payer: Medicaid (Managed Care)

## 2024-05-22 DIAGNOSIS — B977 Papillomavirus as the cause of diseases classified elsewhere: Principal | ICD-10-CM

## 2024-05-24 DIAGNOSIS — N951 Menopausal and female climacteric states: Principal | ICD-10-CM

## 2024-05-24 DIAGNOSIS — E041 Nontoxic single thyroid nodule: Principal | ICD-10-CM

## 2024-05-24 MED ORDER — ESTRADIOL 1 MG TABLET
ORAL_TABLET | Freq: Every day | ORAL | 11 refills | 30.00000 days | Status: CP
Start: 2024-05-24 — End: 2025-05-24

## 2024-05-25 DIAGNOSIS — Z780 Asymptomatic menopausal state: Principal | ICD-10-CM

## 2024-05-25 DIAGNOSIS — N951 Menopausal and female climacteric states: Principal | ICD-10-CM

## 2024-05-25 MED ORDER — PROGESTERONE MICRONIZED 100 MG CAPSULE
ORAL_CAPSULE | Freq: Every day | ORAL | 3 refills | 90.00000 days | Status: CP
Start: 2024-05-25 — End: 2025-05-25

## 2024-05-28 DIAGNOSIS — M25571 Pain in right ankle and joints of right foot: Principal | ICD-10-CM

## 2024-06-09 DIAGNOSIS — F4310 Post-traumatic stress disorder, unspecified: Principal | ICD-10-CM

## 2024-06-09 DIAGNOSIS — E041 Nontoxic single thyroid nodule: Principal | ICD-10-CM

## 2024-06-09 DIAGNOSIS — F331 Major depressive disorder, recurrent, moderate: Principal | ICD-10-CM

## 2024-06-09 MED ORDER — LEVOTHYROXINE 150 MCG TABLET
ORAL_TABLET | Freq: Every day | ORAL | 3 refills | 90.00000 days | Status: CP
Start: 2024-06-09 — End: ?

## 2024-06-23 ENCOUNTER — Ambulatory Visit
Admit: 2024-06-23 | Payer: Medicaid (Managed Care) | Attending: Rehabilitative and Restorative Service Providers" | Primary: Rehabilitative and Restorative Service Providers"

## 2024-06-26 DIAGNOSIS — N951 Menopausal and female climacteric states: Principal | ICD-10-CM

## 2024-06-26 MED ORDER — ESTRADIOL 1 MG TABLET
ORAL_TABLET | Freq: Every day | ORAL | 4 refills | 90.00000 days | Status: CP
Start: 2024-06-26 — End: ?

## 2024-06-29 ENCOUNTER — Inpatient Hospital Stay: Admit: 2024-06-29 | Discharge: 2024-06-29 | Payer: MEDICAID

## 2024-06-29 ENCOUNTER — Encounter: Admit: 2024-06-29 | Discharge: 2024-06-29 | Payer: MEDICAID

## 2024-06-30 MED ORDER — PRAZOSIN 1 MG CAPSULE
ORAL_CAPSULE | ORAL | 0 refills | 0.00000 days | Status: CP
Start: 2024-06-30 — End: ?

## 2024-07-05 ENCOUNTER — Inpatient Hospital Stay: Admit: 2024-07-05 | Discharge: 2024-07-05 | Payer: MEDICAID

## 2024-07-07 DIAGNOSIS — F331 Major depressive disorder, recurrent, moderate: Principal | ICD-10-CM

## 2024-07-07 MED ORDER — RISPERIDONE 3 MG TABLET
ORAL_TABLET | Freq: Every day | ORAL | 0 refills | 90.00000 days | Status: CP
Start: 2024-07-07 — End: ?

## 2024-07-07 MED ORDER — BUPROPION HCL SR 150 MG TABLET,12 HR SUSTAINED-RELEASE
ORAL_TABLET | Freq: Two times a day (BID) | ORAL | 0 refills | 90.00000 days | Status: CP
Start: 2024-07-07 — End: ?

## 2024-07-12 MED ORDER — OMEPRAZOLE 20 MG CAPSULE,DELAYED RELEASE
ORAL_CAPSULE | Freq: Every day | ORAL | 3 refills | 0.00000 days
Start: 2024-07-12 — End: ?

## 2024-07-13 MED ORDER — OMEPRAZOLE 20 MG CAPSULE,DELAYED RELEASE
ORAL_CAPSULE | Freq: Every day | ORAL | 3 refills | 90.00000 days | Status: CP
Start: 2024-07-13 — End: ?

## 2024-07-17 DIAGNOSIS — F4310 Post-traumatic stress disorder, unspecified: Principal | ICD-10-CM

## 2024-07-17 DIAGNOSIS — F331 Major depressive disorder, recurrent, moderate: Principal | ICD-10-CM

## 2024-07-31 DIAGNOSIS — F4310 Post-traumatic stress disorder, unspecified: Principal | ICD-10-CM

## 2024-07-31 DIAGNOSIS — F4321 Adjustment disorder with depressed mood: Principal | ICD-10-CM

## 2024-07-31 DIAGNOSIS — F331 Major depressive disorder, recurrent, moderate: Principal | ICD-10-CM

## 2024-07-31 MED ORDER — PRAZOSIN 5 MG CAPSULE
ORAL_CAPSULE | ORAL | 0 refills | 0.00000 days | Status: CP
Start: 2024-07-31 — End: ?

## 2024-07-31 MED ORDER — PRAZOSIN 1 MG CAPSULE
ORAL_CAPSULE | ORAL | 0 refills | 0.00000 days | Status: CP
Start: 2024-07-31 — End: ?

## 2024-08-14 DIAGNOSIS — G8929 Other chronic pain: Principal | ICD-10-CM

## 2024-08-14 DIAGNOSIS — M25562 Pain in left knee: Principal | ICD-10-CM

## 2024-08-14 DIAGNOSIS — M25561 Pain in right knee: Principal | ICD-10-CM

## 2024-08-27 DIAGNOSIS — M5442 Lumbago with sciatica, left side: Principal | ICD-10-CM

## 2024-08-27 DIAGNOSIS — G8929 Other chronic pain: Principal | ICD-10-CM

## 2024-08-27 DIAGNOSIS — M5441 Lumbago with sciatica, right side: Principal | ICD-10-CM

## 2024-09-10 DIAGNOSIS — F4310 Post-traumatic stress disorder, unspecified: Principal | ICD-10-CM

## 2024-09-10 DIAGNOSIS — F331 Major depressive disorder, recurrent, moderate: Principal | ICD-10-CM

## 2024-09-12 DIAGNOSIS — Z79890 Hormone replacement therapy (postmenopausal): Principal | ICD-10-CM

## 2024-09-12 DIAGNOSIS — N951 Menopausal and female climacteric states: Principal | ICD-10-CM

## 2024-09-12 MED ORDER — ESTRADIOL 0.05 MG/24 HR WEEKLY TRANSDERMAL PATCH
MEDICATED_PATCH | TRANSDERMAL | 12 refills | 28.00000 days | Status: CP
Start: 2024-09-12 — End: 2025-09-12

## 2024-10-01 DIAGNOSIS — F331 Major depressive disorder, recurrent, moderate: Principal | ICD-10-CM

## 2024-10-01 DIAGNOSIS — F4310 Post-traumatic stress disorder, unspecified: Principal | ICD-10-CM

## 2024-10-04 DIAGNOSIS — F331 Major depressive disorder, recurrent, moderate: Principal | ICD-10-CM

## 2024-10-04 MED ORDER — RISPERIDONE 3 MG TABLET
ORAL_TABLET | Freq: Every day | ORAL | 0 refills | 0.00000 days
Start: 2024-10-04 — End: ?

## 2024-10-05 DIAGNOSIS — N951 Menopausal and female climacteric states: Principal | ICD-10-CM

## 2024-10-05 DIAGNOSIS — Z79890 Hormone replacement therapy (postmenopausal): Principal | ICD-10-CM

## 2024-10-05 MED ORDER — ESTRADIOL 0.05 MG/24 HR WEEKLY TRANSDERMAL PATCH
MEDICATED_PATCH | 5 refills | 0.00000 days
Start: 2024-10-05 — End: ?

## 2024-10-05 MED ORDER — RISPERIDONE 3 MG TABLET
ORAL_TABLET | Freq: Every day | ORAL | 0 refills | 90.00000 days | Status: CP
Start: 2024-10-05 — End: ?

## 2024-10-06 MED ORDER — ESTRADIOL 0.05 MG/24 HR WEEKLY TRANSDERMAL PATCH
MEDICATED_PATCH | TRANSDERMAL | 3 refills | 84.00000 days | Status: CP
Start: 2024-10-06 — End: 2025-10-06

## 2024-10-07 MED ORDER — BUPROPION HCL SR 150 MG TABLET,12 HR SUSTAINED-RELEASE
ORAL_TABLET | Freq: Two times a day (BID) | ORAL | 0 refills | 90.00000 days | Status: CP
Start: 2024-10-07 — End: ?

## 2024-10-13 DIAGNOSIS — F4310 Post-traumatic stress disorder, unspecified: Principal | ICD-10-CM

## 2024-10-13 DIAGNOSIS — F331 Major depressive disorder, recurrent, moderate: Principal | ICD-10-CM

## 2024-10-13 MED ORDER — RISPERIDONE 3 MG TABLET
ORAL_TABLET | Freq: Every day | ORAL | 0 refills | 90.00000 days | Status: CP
Start: 2024-10-13 — End: ?

## 2024-10-13 MED ORDER — PRAZOSIN 5 MG CAPSULE
ORAL_CAPSULE | 0 refills | 0.00000 days | Status: CP
Start: 2024-10-13 — End: ?

## 2024-10-13 MED ORDER — PRAZOSIN 1 MG CAPSULE
ORAL_CAPSULE | 0 refills | 0.00000 days | Status: CP
Start: 2024-10-13 — End: ?

## 2024-10-13 MED ORDER — BUPROPION HCL SR 150 MG TABLET,12 HR SUSTAINED-RELEASE
ORAL_TABLET | Freq: Two times a day (BID) | ORAL | 0 refills | 90.00000 days | Status: CP
Start: 2024-10-13 — End: ?

## 2024-10-16 DIAGNOSIS — F4310 Post-traumatic stress disorder, unspecified: Principal | ICD-10-CM

## 2024-10-16 DIAGNOSIS — F331 Major depressive disorder, recurrent, moderate: Secondary | ICD-10-CM
